# Patient Record
Sex: Male | Born: 1975 | Race: Black or African American | Hispanic: No | Marital: Single | Smoking: Former smoker
Health system: Southern US, Community
[De-identification: ages and names within clinical notes are randomized; demographics above are authoritative.]

## PROBLEM LIST (undated history)

## (undated) DIAGNOSIS — F319 Bipolar disorder, unspecified: Secondary | ICD-10-CM

## (undated) DIAGNOSIS — E785 Hyperlipidemia, unspecified: Secondary | ICD-10-CM

## (undated) DIAGNOSIS — I10 Essential (primary) hypertension: Secondary | ICD-10-CM

## (undated) DIAGNOSIS — F259 Schizoaffective disorder, unspecified: Secondary | ICD-10-CM

---

## 1999-09-13 ENCOUNTER — Emergency Department (HOSPITAL_COMMUNITY): Admission: EM | Admit: 1999-09-13 | Discharge: 1999-09-13 | Payer: Self-pay | Admitting: Emergency Medicine

## 2000-01-20 ENCOUNTER — Emergency Department (HOSPITAL_COMMUNITY): Admission: EM | Admit: 2000-01-20 | Discharge: 2000-01-20 | Payer: Self-pay | Admitting: Emergency Medicine

## 2002-01-16 ENCOUNTER — Emergency Department (HOSPITAL_COMMUNITY): Admission: EM | Admit: 2002-01-16 | Discharge: 2002-01-16 | Payer: Self-pay | Admitting: Emergency Medicine

## 2002-01-18 ENCOUNTER — Emergency Department (HOSPITAL_COMMUNITY): Admission: EM | Admit: 2002-01-18 | Discharge: 2002-01-18 | Payer: Self-pay | Admitting: Emergency Medicine

## 2006-08-26 ENCOUNTER — Emergency Department (HOSPITAL_COMMUNITY): Admission: EM | Admit: 2006-08-26 | Discharge: 2006-08-26 | Payer: Self-pay | Admitting: Emergency Medicine

## 2006-10-22 ENCOUNTER — Emergency Department (HOSPITAL_COMMUNITY): Admission: AD | Admit: 2006-10-22 | Discharge: 2006-10-22 | Payer: Self-pay | Admitting: Family Medicine

## 2007-01-10 ENCOUNTER — Emergency Department (HOSPITAL_COMMUNITY): Admission: EM | Admit: 2007-01-10 | Discharge: 2007-01-10 | Payer: Self-pay | Admitting: Family Medicine

## 2007-01-18 ENCOUNTER — Emergency Department (HOSPITAL_COMMUNITY): Admission: EM | Admit: 2007-01-18 | Discharge: 2007-01-18 | Payer: Self-pay | Admitting: Emergency Medicine

## 2007-06-21 ENCOUNTER — Emergency Department (HOSPITAL_COMMUNITY): Admission: EM | Admit: 2007-06-21 | Discharge: 2007-06-21 | Payer: Self-pay | Admitting: Emergency Medicine

## 2007-07-12 ENCOUNTER — Emergency Department (HOSPITAL_COMMUNITY): Admission: EM | Admit: 2007-07-12 | Discharge: 2007-07-12 | Payer: Self-pay | Admitting: Emergency Medicine

## 2007-12-29 ENCOUNTER — Emergency Department (HOSPITAL_COMMUNITY): Admission: EM | Admit: 2007-12-29 | Discharge: 2007-12-29 | Payer: Self-pay | Admitting: Family Medicine

## 2008-04-11 ENCOUNTER — Emergency Department (HOSPITAL_COMMUNITY): Admission: EM | Admit: 2008-04-11 | Discharge: 2008-04-11 | Payer: Self-pay | Admitting: Family Medicine

## 2008-07-12 ENCOUNTER — Emergency Department (HOSPITAL_COMMUNITY): Admission: EM | Admit: 2008-07-12 | Discharge: 2008-07-12 | Payer: Self-pay | Admitting: Family Medicine

## 2009-09-09 ENCOUNTER — Emergency Department (HOSPITAL_COMMUNITY): Admission: EM | Admit: 2009-09-09 | Discharge: 2009-09-09 | Payer: Self-pay | Admitting: Family Medicine

## 2009-09-17 ENCOUNTER — Emergency Department (HOSPITAL_COMMUNITY): Admission: EM | Admit: 2009-09-17 | Discharge: 2009-09-17 | Payer: Self-pay | Admitting: Emergency Medicine

## 2009-11-12 ENCOUNTER — Emergency Department (HOSPITAL_COMMUNITY): Admission: EM | Admit: 2009-11-12 | Discharge: 2009-11-12 | Payer: Self-pay | Admitting: Emergency Medicine

## 2009-12-26 ENCOUNTER — Ambulatory Visit: Payer: Self-pay | Admitting: Diagnostic Radiology

## 2009-12-26 ENCOUNTER — Emergency Department (HOSPITAL_BASED_OUTPATIENT_CLINIC_OR_DEPARTMENT_OTHER): Admission: EM | Admit: 2009-12-26 | Discharge: 2009-12-26 | Payer: Self-pay | Admitting: Orthopedic Surgery

## 2010-01-22 ENCOUNTER — Emergency Department (HOSPITAL_COMMUNITY): Admission: EM | Admit: 2010-01-22 | Discharge: 2010-01-22 | Payer: Self-pay | Admitting: Family Medicine

## 2010-12-01 ENCOUNTER — Emergency Department (HOSPITAL_COMMUNITY)
Admission: EM | Admit: 2010-12-01 | Discharge: 2010-12-01 | Payer: Self-pay | Source: Home / Self Care | Admitting: Family Medicine

## 2011-02-04 LAB — CBC
HCT: 41 % (ref 39.0–52.0)
Hemoglobin: 14 g/dL (ref 13.0–17.0)
MCHC: 34.3 g/dL (ref 30.0–36.0)
MCV: 88.7 fL (ref 78.0–100.0)
RBC: 4.62 MIL/uL (ref 4.22–5.81)
RDW: 14.1 % (ref 11.5–15.5)

## 2011-02-04 LAB — URINE MICROSCOPIC-ADD ON

## 2011-02-04 LAB — URINALYSIS, ROUTINE W REFLEX MICROSCOPIC
Glucose, UA: NEGATIVE mg/dL
Leukocytes, UA: NEGATIVE
Protein, ur: 100 mg/dL — AB
Specific Gravity, Urine: 1.027 (ref 1.005–1.030)
Urobilinogen, UA: 1 mg/dL (ref 0.0–1.0)

## 2011-02-04 LAB — COMPREHENSIVE METABOLIC PANEL
BUN: 9 mg/dL (ref 6–23)
CO2: 26 mEq/L (ref 19–32)
Calcium: 9.4 mg/dL (ref 8.4–10.5)
GFR calc non Af Amer: >60 mL/min (ref >60)
Glucose, Bld: 80 mg/dL (ref 70–99)
Total Protein: 8.7 g/dL — ABNORMAL HIGH (ref 6.0–8.3)

## 2011-02-04 LAB — DIFFERENTIAL
Basophils Relative: 2 % — ABNORMAL HIGH (ref 0–1)
Eosinophils Absolute: 0.3 10*3/uL (ref 0.0–0.7)
Lymphs Abs: 1.1 10*3/uL (ref 0.7–4.0)
Monocytes Relative: 15 % — ABNORMAL HIGH (ref 3–12)
Neutro Abs: 4.6 10*3/uL (ref 1.7–7.7)
Neutrophils Relative %: 64 % (ref 43–77)

## 2011-02-04 LAB — POCT TOXICOLOGY PANEL: Tetrahydrocannabinol: POSITIVE

## 2012-10-14 ENCOUNTER — Emergency Department (HOSPITAL_COMMUNITY): Payer: Medicare Other

## 2012-10-14 ENCOUNTER — Encounter (HOSPITAL_COMMUNITY): Payer: Self-pay | Admitting: *Deleted

## 2012-10-14 ENCOUNTER — Emergency Department (HOSPITAL_COMMUNITY)
Admission: EM | Admit: 2012-10-14 | Discharge: 2012-10-14 | Disposition: A | Discharge status: Home / Self Care | Payer: Medicare Other | Attending: Emergency Medicine | Admitting: Emergency Medicine

## 2012-10-14 DIAGNOSIS — R51 Headache: Secondary | ICD-10-CM | POA: Insufficient documentation

## 2012-10-14 DIAGNOSIS — D72829 Elevated white blood cell count, unspecified: Secondary | ICD-10-CM | POA: Insufficient documentation

## 2012-10-14 DIAGNOSIS — I10 Essential (primary) hypertension: Secondary | ICD-10-CM | POA: Insufficient documentation

## 2012-10-14 DIAGNOSIS — Z79899 Other long term (current) drug therapy: Secondary | ICD-10-CM | POA: Insufficient documentation

## 2012-10-14 DIAGNOSIS — F17200 Nicotine dependence, unspecified, uncomplicated: Secondary | ICD-10-CM | POA: Insufficient documentation

## 2012-10-14 HISTORY — DX: Essential (primary) hypertension: I10

## 2012-10-14 LAB — COMPREHENSIVE METABOLIC PANEL
ALT: 41 U/L (ref 0–53)
AST: 31 U/L (ref 0–37)
Alkaline Phosphatase: 103 U/L (ref 39–117)
CO2: 23 mEq/L (ref 19–32)
Calcium: 9.9 mg/dL (ref 8.4–10.5)
Chloride: 106 mEq/L (ref 96–112)
GFR calc Af Amer: >90 mL/min (ref >90)
GFR calc non Af Amer: >90 mL/min (ref >90)
Glucose, Bld: 103 mg/dL — ABNORMAL HIGH (ref 70–99)
Potassium: 4.3 mEq/L (ref 3.5–5.1)
Sodium: 143 mEq/L (ref 135–145)

## 2012-10-14 LAB — CBC WITH DIFFERENTIAL/PLATELET
Basophils Absolute: 0 10*3/uL (ref 0.0–0.1)
Eosinophils Relative: 8 % — ABNORMAL HIGH (ref 0–5)
Lymphocytes Relative: 39 % (ref 12–46)
Lymphs Abs: 4.3 10*3/uL — ABNORMAL HIGH (ref 0.7–4.0)
MCV: 87 fL (ref 78.0–100.0)
Neutro Abs: 5 10*3/uL (ref 1.7–7.7)
Neutrophils Relative %: 45 % (ref 43–77)
Platelets: 261 10*3/uL (ref 150–400)
RBC: 4.62 MIL/uL (ref 4.22–5.81)
RDW: 14.7 % (ref 11.5–15.5)
WBC: 11.1 10*3/uL — ABNORMAL HIGH (ref 4.0–10.5)

## 2012-10-14 MED ORDER — PRAVASTATIN SODIUM 20 MG PO TABS: 20.0000 mg | ORAL_TABLET | Freq: Every day | ORAL | Status: DC

## 2012-10-14 MED ORDER — LISINOPRIL 10 MG PO TABS
10.0000 mg | ORAL_TABLET | Freq: Once | ORAL | Status: AC
  Administered 2012-10-14: 10 mg via ORAL
  Filled 2012-10-14: qty 1

## 2012-10-14 MED ORDER — AMLODIPINE BESYLATE 10 MG PO TABS
10.0000 mg | ORAL_TABLET | Freq: Once | ORAL | Status: AC
  Administered 2012-10-14: 10 mg via ORAL
  Filled 2012-10-14 (×2): qty 1

## 2012-10-14 MED ORDER — AMLODIPINE BESYLATE 10 MG PO TABS: 10.0000 mg | ORAL_TABLET | Freq: Once | ORAL | Status: DC

## 2012-10-14 MED ORDER — LISINOPRIL 10 MG PO TABS: 10.0000 mg | ORAL_TABLET | Freq: Once | ORAL | Status: DC

## 2012-10-14 MED ORDER — ACETAMINOPHEN 325 MG PO TABS
650.0000 mg | ORAL_TABLET | Freq: Once | ORAL | Status: AC
  Administered 2012-10-14: 650 mg via ORAL
  Filled 2012-10-14: qty 2

## 2012-10-14 MED ORDER — IBUPROFEN 400 MG PO TABS
800.0000 mg | ORAL_TABLET | Freq: Once | ORAL | Status: DC
  Filled 2012-10-14: qty 2

## 2012-10-14 MED ORDER — FUROSEMIDE 20 MG PO TABS
20.0000 mg | ORAL_TABLET | Freq: Once | ORAL | Status: AC
  Administered 2012-10-14: 20 mg via ORAL
  Filled 2012-10-14: qty 1

## 2012-10-14 MED ORDER — FUROSEMIDE 20 MG PO TABS: 20.0000 mg | ORAL_TABLET | Freq: Once | ORAL | Status: DC

## 2012-10-14 NOTE — ED Provider Notes (Signed)
History     CSN: [id number]  Arrival date & time 10/14/12  1910   First MD Initiated Contact with Patient 10/14/12 2021      Chief Complaint  Patient presents with  . Headache    (Consider location/radiation/quality/duration/timing/severity/associated sxs/prior treatment) HPI Comments: Patient presents with complaint of headache. Patient states that this headache is in his temples, intermittent, and 5/10. He states that he has been out of blood pressure medication for three week and that this is similar to headaches he gets when his blood pressure is high. He requests medication refill. Denies fevers or chills. Denies NVD or abdominal pain, denies photophobia or visual disturbance. Denies that this is the worst headache of his life.   The history is provided by the patient. No language interpreter was used.    Past Medical History  Diagnosis Date  . Hypertension     History reviewed. No pertinent past surgical history.  No family history on file.  History  Substance Use Topics  . Smoking status: Current Every Day Smoker  . Smokeless tobacco: Not on file  . Alcohol Use: No      Review of Systems  Constitutional: Negative for fever and chills.  Eyes: Negative for photophobia and visual disturbance.  Gastrointestinal: Negative for nausea, vomiting, abdominal pain and diarrhea.  Neurological: Negative for headaches.    Allergies  Review of patient's allergies indicates no known allergies.  Home Medications   Current Outpatient Rx  Name  Route  Sig  Dispense  Refill  . AMLODIPINE BESYLATE 10 MG PO TABS   Oral   Take 10 mg by mouth daily.         . FUROSEMIDE 20 MG PO TABS   Oral   Take 20 mg by mouth daily.         Marland Kitchen LISINOPRIL 10 MG PO TABS   Oral   Take 10 mg by mouth daily.         Marland Kitchen PRAVASTATIN SODIUM 20 MG PO TABS   Oral   Take 20 mg by mouth daily.         . DAYQUIL PO   Oral   Take 2 capsules by mouth daily as needed. For cough           . TERBINAFINE HCL 250 MG PO TABS   Oral   Take 250 mg by mouth daily.           BP 146/87  Pulse 72  Temp 98.2 F (36.8 C) (Oral)  Resp 20  SpO2 98%  Physical Exam  Nursing note and vitals reviewed. Constitutional: He is oriented to person, place, and time. He appears well-developed and well-nourished.  HENT:  Head: Normocephalic and atraumatic.  Mouth/Throat: Oropharynx is clear and moist.  Eyes: Conjunctivae normal and EOM are normal. Pupils are equal, round, and reactive to light. No scleral icterus.  Neck: Normal range of motion. Neck supple.  Cardiovascular: Normal rate, regular rhythm and normal heart sounds.   Pulmonary/Chest: Effort normal and breath sounds normal. He has no wheezes.  Abdominal: Soft. Bowel sounds are normal. There is no tenderness.  Lymphadenopathy:    He has no cervical adenopathy.  Neurological: He is alert and oriented to person, place, and time. No cranial nerve deficit. He exhibits normal muscle tone. Coordination normal.       Cranial nerves II-XII grossly intact. No pronator drift. Negative romberg.   Skin: Skin is warm and dry.    ED Course  Procedures (  including critical care time)  Labs Reviewed  CBC WITH DIFFERENTIAL - Abnormal; Notable for the following:    WBC 11.1 (*)     Lymphs Abs 4.3 (*)     Eosinophils Relative 8 (*)     Eosinophils Absolute 0.9 (*)     All other components within normal limits  COMPREHENSIVE METABOLIC PANEL - Abnormal; Notable for the following:    Glucose, Bld 103 (*)     Total Bilirubin 0.1 (*)     All other components within normal limits  TROPONIN I   Dg Chest 2 View  10/14/2012  *RADIOLOGY REPORT*  Clinical Data: Headache.  Left-sided chest pain  CHEST - 2 VIEW  Comparison: 12/26/2009  Findings: Heart size is upper normal.  Negative for heart failure. Lungs are clear without infiltrate effusion or mass.  IMPRESSION: No acute abnormality.   Original Report Authenticated By: Janeece Riggers, M.D.     Date: 10/14/2012  Rate: 76  Rhythm: normal sinus rhythm  QRS Axis: normal  Intervals: normal  ST/T Wave abnormalities: normal  Conduction Disutrbances:none  Narrative Interpretation: NO STEMI  Old EKG Reviewed: none available     1. Headache   2. Hypertension   3. Leukocytosis       MDM  Patient presents with complaint of headache like his other "hypertensive" headaches. Patient given tylenol with improvement. Also requesting medication refills. CBC: remarkable for mild leukocytosis. Other labs, EKG, and CXR unremarkable. Discharged with refills and return precautions.         Pixie Casino, PA-C 10/14/12 2116

## 2012-10-14 NOTE — ED Notes (Signed)
The pt has been out of his meds for bp a fluid pill  And other meds for 2 weeks.  He is also c/o a headache  And some intermittent chest pain

## 2012-10-14 NOTE — ED Notes (Signed)
Pt states that he has had blurred vision occasionally with his headaches. Pt denies dizzines.

## 2012-10-15 NOTE — ED Provider Notes (Signed)
Medical screening examination/treatment/procedure(s) were performed by non-physician practitioner and as supervising physician I was immediately available for consultation/collaboration.   Carleene Cooper III, MD 10/15/12 1300

## 2013-06-25 ENCOUNTER — Emergency Department (INDEPENDENT_AMBULATORY_CARE_PROVIDER_SITE_OTHER)
Admission: EM | Admit: 2013-06-25 | Discharge: 2013-06-25 | Disposition: A | Discharge status: Home / Self Care | Payer: Medicare Other | Source: Home / Self Care

## 2013-06-25 ENCOUNTER — Encounter (HOSPITAL_COMMUNITY): Payer: Self-pay | Admitting: Emergency Medicine

## 2013-06-25 DIAGNOSIS — M25569 Pain in unspecified knee: Secondary | ICD-10-CM

## 2013-06-25 DIAGNOSIS — M25562 Pain in left knee: Secondary | ICD-10-CM

## 2013-06-25 MED ORDER — MELOXICAM 15 MG PO TABS: 15.0000 mg | ORAL_TABLET | Freq: Every day | ORAL | Status: DC | PRN

## 2013-06-25 NOTE — ED Notes (Signed)
C/o left leg pain since last night.   Injury to knee when younger.

## 2013-06-25 NOTE — ED Provider Notes (Signed)
Kenneth Wilson is a 37 y.o. male who presents to Urgent Care today for left knee pain present for the last several days. Patient denies any specific injury but notes that the pain seemed to worsen after doing some squats. He notes that he has had chronic of the anterior knee pain on his current pain is different. His pain is felt deep inside the knee and on the lateral joint line. He denies any locking or catching or significant swelling. He denies any radiating pain weakness or numbness. He has not tried any medications yet. He denies any fevers or chills.   PMH reviewed. Hypertension History  Substance Use Topics  . Smoking status: Current Every Day Smoker  . Smokeless tobacco: Not on file  . Alcohol Use: No   ROS as above Medications reviewed. No current facility-administered medications for this encounter.   Current Outpatient Prescriptions  Medication Sig Dispense Refill  . amLODipine (NORVASC) 10 MG tablet Take 10 mg by mouth daily.      . meloxicam (MOBIC) 15 MG tablet Take 1 tablet (15 mg total) by mouth daily as needed for pain.  14 tablet  0    Exam:  BP 123/73  Pulse 65  Temp(Src) 98 F (36.7 C) (Oral)  Resp 20  SpO2 100% Gen: Well NAD LEFT KNEE: Normal-appearing no significant effusion Range of motion 5-100 No  crepitations on extension Tender palpation lateral joint line Positive McMurray's test laterally. Negative Lachman's valgus varus stress.  Capillary refill and sensation are intact distally bilateral lower extremities.   No results found for this or any previous visit (from the past 24 hour(s)). No results found.  Assessment and Plan: 37 y.o. male with knee pain possible meniscus injury. Plan he is meloxicam for 2 weeks. If not improving followup with Dr. Katrinka Blazing for further evaluation and management.  Discussed warning signs or symptoms. Please see discharge instructions. Patient expresses understanding.      Rodolph Bong, MD 06/25/13 2040

## 2013-08-01 ENCOUNTER — Encounter: Payer: Self-pay | Admitting: Endocrinology

## 2013-08-01 ENCOUNTER — Ambulatory Visit (INDEPENDENT_AMBULATORY_CARE_PROVIDER_SITE_OTHER): Payer: Medicaid Other | Admitting: Endocrinology

## 2013-08-01 VITALS — BP 122/82 | HR 94 | Temp 99.2°F | Resp 10 | Ht 72.0 in | Wt 279.0 lb

## 2013-08-01 DIAGNOSIS — E785 Hyperlipidemia, unspecified: Secondary | ICD-10-CM

## 2013-08-01 DIAGNOSIS — F209 Schizophrenia, unspecified: Secondary | ICD-10-CM

## 2013-08-01 DIAGNOSIS — F649 Gender identity disorder, unspecified: Secondary | ICD-10-CM | POA: Insufficient documentation

## 2013-08-01 DIAGNOSIS — I10 Essential (primary) hypertension: Secondary | ICD-10-CM

## 2013-08-01 DIAGNOSIS — F640 Transsexualism: Secondary | ICD-10-CM

## 2013-08-01 NOTE — Patient Instructions (Addendum)
i'll have to discuss this with a urologist, and i'll let you know.   Usually, the first step is to have the testicles removed.   The second step is to prescribe estrogen.

## 2013-08-01 NOTE — Progress Notes (Signed)
Subjective:    Patient ID: Kenneth Wilson, male    DOB: [birth year], 37 y.o.   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 She has never been on medication for this.  She says she was dx'ed at the [facility] center.  She has moderate swelling at the breasts bilaterally, but no assoc pain. Past Medical History  Diagnosis Date  . Hypertension     History reviewed. No pertinent past surgical history.  History   Social History  . Marital Status: Single    Spouse Name: N/A    Number of Children: N/A  . Years of Education: N/A   Occupational History  . Not on file.   Social History Main Topics  . Smoking status: Former Games developer  . Smokeless tobacco: Not on file  . Alcohol Use: No  . Drug Use: No  . Sexual Activity: No   Other Topics Concern  . Not on file   Social History Narrative   Regular [facility]   Caffeine use: 1 cup of coffee daily    Current Outpatient Prescriptions on File Prior to Visit  Medication Sig Dispense Refill  . amLODipine (NORVASC) 10 MG tablet Take 10 mg by mouth daily.      . meloxicam (MOBIC) 15 MG tablet Take 1 tablet (15 mg total) by mouth daily as needed for pain.  14 tablet  0   No current facility-administered medications on file prior to visit.    No Known Allergies  History reviewed. No pertinent family history. Neg for gender identity issues BP 122/82  Pulse 94  Temp(Src) 99.2 F (37.3 C) (Oral)  Resp 10  Ht 6' (1.829 m)  Wt 279 lb (126.554 kg)  BMI 37.83 kg/m2  SpO2 97%  Review of Systems  Constitutional: Negative for fever.       She has lost weight, due to her efforts.  HENT: Negative for hearing loss.   Eyes: Negative for visual disturbance.  Respiratory: Negative for shortness of breath.   Cardiovascular: Negative for chest pain.  Gastrointestinal: Negative for blood in stool.  Endocrine: Negative for cold intolerance.  Genitourinary: Negative for hematuria.   Musculoskeletal: Negative for back pain.  Skin: Negative for rash.  Allergic/Immunologic: Positive for environmental allergies.  Neurological: Negative for numbness and headaches.  Hematological: Does not bruise/bleed easily.  Psychiatric/Behavioral: Positive for dysphoric mood.      Objective:   Physical Exam VS: see vs page GEN: no distress HEAD: head: no deformity eyes: no periorbital swelling, no proptosis external nose and ears are normal mouth: no lesion seen NECK: supple, thyroid is not enlarged CHEST WALL: no deformity LUNGS:  Clear to auscultation BREASTS:  Moderate bilateral pseudogynecomastia.  CV: reg rate and rhythm, no murmur ABD: abdomen is soft, nontender.  no hepatosplenomegaly.  not distended.  no hernia MUSCULOSKELETAL: muscle bulk and strength are grossly normal.  no obvious joint swelling.  gait is normal and steady EXTEMITIES: no deformity.  no ulcer on the feet.  feet are of normal color and temp.  no edema PULSES: dorsalis pedis intact bilat.  no carotid bruit NEURO:  cn 2-12 grossly intact.   readily moves all 4's.  sensation is intact to touch on the feet SKIN:  Normal texture and temperature.  No rash or suspicious lesion is visible.   NODES:  None palpable at the neck PSYCH: alert, oriented x3.  Does not appear anxious nor depressed.  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  i told pt this is a fluid situation, as medicare has just started paying for transgender surgery, and commerical insurance is likely to start soon.  i told pt she may choose orchiectomy for now, followed by estrogen rx, which i would be happy to prescribe.  pt says she wants to pursue transgender surgery now.  She will ask her insurance, and call us back.    Schizophrenia: this limits the interpretation  of sxs HTN: pt would have to be monitored wen started on estrogen

## 2013-08-03 ENCOUNTER — Telehealth: Payer: Self-pay

## 2013-08-03 DIAGNOSIS — F649 Gender identity disorder, unspecified: Secondary | ICD-10-CM

## 2013-08-03 NOTE — Progress Notes (Signed)
  Subjective:    Patient ID: Kenneth Wilson, male    DOB: Jun 03, 1976, 37 y.o.   MRN: [id number]  HPI    Review of Systems     Objective:   Physical Exam GENITALIA: Normal male testicles, scrotum, and penis Skin: moderate male terminal hair.       Assessment & Plan:

## 2013-08-03 NOTE — Telephone Encounter (Signed)
i have requested

## 2013-08-03 NOTE — Telephone Encounter (Signed)
Pt states that he needs to know who you recommend as a urologist that will perform the procedure that he needs, he check ed with his ins., the urologist here in  do not do the procedure. [phone]

## 2013-08-03 NOTE — Telephone Encounter (Signed)
Pt advised.

## 2013-08-07 ENCOUNTER — Telehealth: Payer: Self-pay

## 2013-08-07 NOTE — Telephone Encounter (Signed)
Pt was advised per Beverly Gust and Dr. Everardo All pt needs to get a referral from his pcp listed on medicaid card

## 2013-08-07 NOTE — Telephone Encounter (Signed)
Message copied by Sharlyne Pacas on Tue Aug 07, 2013  1:19 PM ------      Message from: Romero Belling      Created: Tue Aug 07, 2013 12:36 PM       Ok, please call patient and pcp and advise them of this.                  ----- Message -----         From: Sharlyne Pacas, CMA         Sent: 08/07/2013   9:20 AM           To: Romero Belling, MD                        ----- Message -----         From: Janee Morn         Sent: 08/07/2013   9:07 AM           To: Romero Belling, MD, Sharlyne Pacas, CMA            This pt has [place] access referral will need to come from provider office/ doctor listed on card.        ------

## 2013-09-25 ENCOUNTER — Ambulatory Visit
Admission: RE | Admit: 2013-09-25 | Discharge: 2013-09-25 | Disposition: A | Discharge status: Home / Self Care | Payer: Medicare HMO | Source: Ambulatory Visit | Attending: Family | Admitting: Family

## 2013-09-25 ENCOUNTER — Other Ambulatory Visit: Payer: Self-pay | Admitting: Family

## 2013-09-25 DIAGNOSIS — M79605 Pain in left leg: Secondary | ICD-10-CM

## 2014-02-21 ENCOUNTER — Emergency Department (INDEPENDENT_AMBULATORY_CARE_PROVIDER_SITE_OTHER)
Admission: EM | Admit: 2014-02-21 | Discharge: 2014-02-21 | Disposition: A | Discharge status: Home / Self Care | Payer: Medicare HMO | Source: Home / Self Care | Attending: Emergency Medicine | Admitting: Emergency Medicine

## 2014-02-21 ENCOUNTER — Encounter (HOSPITAL_COMMUNITY): Payer: Self-pay | Admitting: Emergency Medicine

## 2014-02-21 DIAGNOSIS — J309 Allergic rhinitis, unspecified: Secondary | ICD-10-CM

## 2014-02-21 DIAGNOSIS — R059 Cough, unspecified: Secondary | ICD-10-CM

## 2014-02-21 DIAGNOSIS — R05 Cough: Secondary | ICD-10-CM

## 2014-02-21 DIAGNOSIS — J9801 Acute bronchospasm: Secondary | ICD-10-CM

## 2014-02-21 MED ORDER — CETIRIZINE HCL 10 MG PO TABS: 10.0000 mg | ORAL_TABLET | Freq: Every day | ORAL | Status: DC

## 2014-02-21 MED ORDER — FLUTICASONE PROPIONATE 50 MCG/ACT NA SUSP: 2.0000 | Freq: Every day | NASAL | Status: DC

## 2014-02-21 MED ORDER — IPRATROPIUM BROMIDE 0.02 % IN SOLN
0.5000 mg | Freq: Once | RESPIRATORY_TRACT | Status: AC
  Administered 2014-02-21: 0.5 mg via RESPIRATORY_TRACT

## 2014-02-21 MED ORDER — IPRATROPIUM BROMIDE 0.02 % IN SOLN
RESPIRATORY_TRACT | Status: AC
  Filled 2014-02-21: qty 2.5

## 2014-02-21 MED ORDER — ALBUTEROL SULFATE HFA 108 (90 BASE) MCG/ACT IN AERS
INHALATION_SPRAY | RESPIRATORY_TRACT | Status: AC
  Filled 2014-02-21: qty 6.7

## 2014-02-21 MED ORDER — ALBUTEROL SULFATE (2.5 MG/3ML) 0.083% IN NEBU
INHALATION_SOLUTION | RESPIRATORY_TRACT | Status: AC
  Filled 2014-02-21: qty 6

## 2014-02-21 MED ORDER — ALBUTEROL SULFATE HFA 108 (90 BASE) MCG/ACT IN AERS: 1.0000 | INHALATION_SPRAY | Freq: Four times a day (QID) | RESPIRATORY_TRACT | Status: DC | PRN

## 2014-02-21 MED ORDER — ALBUTEROL SULFATE (2.5 MG/3ML) 0.083% IN NEBU
5.0000 mg | INHALATION_SOLUTION | Freq: Once | RESPIRATORY_TRACT | Status: AC
  Administered 2014-02-21: 5 mg via RESPIRATORY_TRACT

## 2014-02-21 NOTE — Discharge Instructions (Signed)
Allergic Rhinitis °Allergic rhinitis is when the mucous membranes in the nose respond to allergens. Allergens are particles in the air that cause your body to have an allergic reaction. This causes you to release allergic antibodies. Through a chain of events, these eventually cause you to release histamine into the blood stream. Although meant to protect the body, it is this release of histamine that causes your discomfort, such as frequent sneezing, congestion, and an itchy, runny nose.  °CAUSES  °Seasonal allergic rhinitis (hay fever) is caused by pollen allergens that may come from grasses, trees, and weeds. Year-round allergic rhinitis (perennial allergic rhinitis) is caused by allergens such as house dust mites, pet dander, and mold spores.  °SYMPTOMS  °· Nasal stuffiness (congestion). °· Itchy, runny nose with sneezing and tearing of the eyes. °DIAGNOSIS  °Your health care provider can help you determine the allergen or allergens that trigger your symptoms. If you and your health care provider are unable to determine the allergen, skin or blood testing may be used. °TREATMENT  °Allergic Rhinitis does not have a cure, but it can be controlled by: °· Medicines and allergy shots (immunotherapy). °· Avoiding the allergen. °Hay fever may often be treated with antihistamines in pill or nasal spray forms. Antihistamines block the effects of histamine. There are over-the-counter medicines that may help with nasal congestion and swelling around the eyes. Check with your health care provider before taking or giving this medicine.  °If avoiding the allergen or the medicine prescribed do not work, there are many new medicines your health care provider can prescribe. Stronger medicine may be used if initial measures are ineffective. Desensitizing injections can be used if medicine and avoidance does not work. Desensitization is when a patient is given ongoing shots until the body becomes less sensitive to the allergen.  Make sure you follow up with your health care provider if problems continue. °HOME CARE INSTRUCTIONS °It is not possible to completely avoid allergens, but you can reduce your symptoms by taking steps to limit your exposure to them. It helps to know exactly what you are allergic to so that you can avoid your specific triggers. °SEEK MEDICAL CARE IF:  °· You have a fever. °· You develop a cough that does not stop easily (persistent). °· You have shortness of breath. °· You start wheezing. °· Symptoms interfere with normal daily activities. °Document Released: 07/27/2001 Document Revised: 08/22/2013 Document Reviewed: 07/09/2013 °ExitCare® Patient Information ©2014 [employer]. ° °Bronchospasm, Adult °A bronchospasm is a spasm or tightening of the airways going into the lungs. During a bronchospasm breathing becomes more difficult because the airways get smaller. When this happens there can be coughing, a whistling sound when breathing (wheezing), and difficulty breathing. Bronchospasm is often associated with asthma, but not all patients who experience a bronchospasm have asthma. °CAUSES  °A bronchospasm is caused by inflammation or irritation of the airways. The inflammation or irritation may be triggered by:  °· Allergies (such as to animals, pollen, food, or mold). Allergens that cause bronchospasm may cause wheezing immediately after exposure or many hours later.   °· Infection. Viral infections are believed to be the most common cause of bronchospasm.   °· Exercise.   °· Irritants (such as pollution, cigarette smoke, strong odors, aerosol sprays, and paint fumes).   °· Weather changes. Winds increase molds and pollens in the air. Rain refreshes the air by washing irritants out. Cold air may cause inflammation.   °· Stress and emotional upset.   °SIGNS AND SYMPTOMS  °· Wheezing.   °·   Excessive nighttime coughing.   Frequent or severe coughing with a simple cold.   Chest tightness.   Shortness of  breath.  DIAGNOSIS  Bronchospasm is usually diagnosed through a history and physical exam. Tests, such as chest X-rays, are sometimes done to look for other conditions. TREATMENT   Inhaled medicines can be given to open up your airways and help you breathe. The medicines can be given using either an inhaler or a nebulizer machine.  Corticosteroid medicines may be given for severe bronchospasm, usually when it is associated with asthma. HOME CARE INSTRUCTIONS   Always have a plan prepared for seeking medical care. Know when to call your health care provider and local emergency services (911 in the [place] Know where you can access local emergency care.  Only take medicines as directed by your health care provider.  If you were prescribed an inhaler or nebulizer machine, ask your health care provider to explain how to use it correctly. Always use a spacer with your inhaler if you were given one.  It is necessary to remain calm during an attack. Try to relax and breathe more slowly.  Control your home environment in the following ways:   Change your heating and air conditioning filter at least once a month.   Limit your use of fireplaces and wood stoves.  Do not smoke and do not allow smoking in your home.   Avoid exposure to perfumes and fragrances.   Get rid of pests (such as roaches and mice) and their droppings.   Throw away plants if you see mold on them.   Keep your house clean and dust free.   Replace carpet with wood, tile, or vinyl flooring. Carpet can trap dander and dust.   Use allergy-proof pillows, mattress covers, and box spring covers.   Wash bed sheets and blankets every week in hot water and dry them in a dryer.   Use blankets that are made of polyester or cotton.   Wash hands frequently. SEEK MEDICAL CARE IF:   You have muscle aches.   You have chest pain.   The sputum changes from clear or white to yellow, green, gray, or bloody.   The  sputum you cough up gets thicker.   There are problems that may be related to the medicine you are given, such as a rash, itching, swelling, or trouble breathing.  SEEK IMMEDIATE MEDICAL CARE IF:   You have worsening wheezing and coughing even after taking your prescribed medicines.   You have increased difficulty breathing.   You develop severe chest pain. MAKE SURE YOU:   Understand these instructions.  Will watch your condition.  Will get help right away if you are not doing well or get worse. Document Released: 11/04/2003 Document Revised: 07/04/2013 Document Reviewed: 04/23/2013 [facility] Patient Information 2014 [place], [place].  Bronchospasm, Adult A bronchospasm is a spasm or tightening of the airways going into the lungs. During a bronchospasm breathing becomes more difficult because the airways get smaller. When this happens there can be coughing, a whistling sound when breathing (wheezing), and difficulty breathing. Bronchospasm is often associated with asthma, but not all patients who experience a bronchospasm have asthma. CAUSES  A bronchospasm is caused by inflammation or irritation of the airways. The inflammation or irritation may be triggered by:   Allergies (such as to animals, pollen, food, or mold). Allergens that cause bronchospasm may cause wheezing immediately after exposure or many hours later.   Infection. Viral infections are believed to  be the most common cause of bronchospasm.   Exercise.   Irritants (such as pollution, cigarette smoke, strong odors, aerosol sprays, and paint fumes).   Weather changes. Winds increase molds and pollens in the air. Rain refreshes the air by washing irritants out. Cold air may cause inflammation.   Stress and emotional upset.  SIGNS AND SYMPTOMS   Wheezing.   Excessive nighttime coughing.   Frequent or severe coughing with a simple cold.   Chest tightness.   Shortness of breath.  DIAGNOSIS    Bronchospasm is usually diagnosed through a history and physical exam. Tests, such as chest X-rays, are sometimes done to look for other conditions. TREATMENT   Inhaled medicines can be given to open up your airways and help you breathe. The medicines can be given using either an inhaler or a nebulizer machine.  Corticosteroid medicines may be given for severe bronchospasm, usually when it is associated with asthma. HOME CARE INSTRUCTIONS   Always have a plan prepared for seeking medical care. Know when to call your health care provider and local emergency services (911 in the [place] Know where you can access local emergency care.  Only take medicines as directed by your health care provider.  If you were prescribed an inhaler or nebulizer machine, ask your health care provider to explain how to use it correctly. Always use a spacer with your inhaler if you were given one.  It is necessary to remain calm during an attack. Try to relax and breathe more slowly.  Control your home environment in the following ways:   Change your heating and air conditioning filter at least once a month.   Limit your use of fireplaces and wood stoves.  Do not smoke and do not allow smoking in your home.   Avoid exposure to perfumes and fragrances.   Get rid of pests (such as roaches and mice) and their droppings.   Throw away plants if you see mold on them.   Keep your house clean and dust free.   Replace carpet with wood, tile, or vinyl flooring. Carpet can trap dander and dust.   Use allergy-proof pillows, mattress covers, and box spring covers.   Wash bed sheets and blankets every week in hot water and dry them in a dryer.   Use blankets that are made of polyester or cotton.   Wash hands frequently. SEEK MEDICAL CARE IF:   You have muscle aches.   You have chest pain.   The sputum changes from clear or white to yellow, green, gray, or bloody.   The sputum you cough up  gets thicker.   There are problems that may be related to the medicine you are given, such as a rash, itching, swelling, or trouble breathing.  SEEK IMMEDIATE MEDICAL CARE IF:   You have worsening wheezing and coughing even after taking your prescribed medicines.   You have increased difficulty breathing.   You develop severe chest pain. MAKE SURE YOU:   Understand these instructions.  Will watch your condition.  Will get help right away if you are not doing well or get worse. Document Released: 11/04/2003 Document Revised: 07/04/2013 Document Reviewed: 04/23/2013 [facility] Patient Information 2014 [place], [place].

## 2014-02-21 NOTE — ED Provider Notes (Signed)
CSN: [id number]     Arrival date & time 02/21/14  1136 History   First MD Initiated Contact with Patient 02/21/14 1224     Chief Complaint  Patient presents with  . Cough   (Consider location/radiation/quality/duration/timing/severity/associated sxs/prior Treatment) Patient is a 38 y.o. male presenting with cough. The history is provided by the patient.  Cough Cough characteristics:  Harsh Severity:  Moderate Onset quality:  Gradual Duration:  3 days Timing:  Intermittent Progression:  Unchanged Chronicity:  New Smoker: yes   Context: exposure to allergens   Associated symptoms: rhinorrhea, shortness of breath, sinus congestion and wheezing   Associated symptoms: no chest pain, no chills, no diaphoresis, no ear fullness, no ear pain, no eye discharge, no fever, no headaches, no myalgias, no rash, no sore throat and no weight loss   Associated symptoms comment:  +post nasal drainage   Past Medical History  Diagnosis Date  . Hypertension    History reviewed. No pertinent past surgical history. History reviewed. No pertinent family history. History  Substance Use Topics  . Smoking status: Former Games developermoker  . Smokeless tobacco: Not on file  . Alcohol Use: No    Review of Systems  Constitutional: Negative for fever, chills, weight loss and diaphoresis.  HENT: Positive for congestion, rhinorrhea and sneezing. Negative for ear pain, mouth sores and sore throat.   Eyes: Negative.  Negative for discharge.  Respiratory: Positive for cough, chest tightness, shortness of breath and wheezing.   Cardiovascular: Negative for chest pain, palpitations and leg swelling.  Gastrointestinal: Negative.   Endocrine: Negative for polydipsia, polyphagia and polyuria.  Genitourinary: Negative.   Musculoskeletal: Negative.  Negative for myalgias.  Skin: Negative.  Negative for rash.  Neurological: Negative.  Negative for headaches.    Allergies  Review of patient's allergies indicates no known  allergies.  Home Medications   Current Outpatient Rx  Name  Route  Sig  Dispense  Refill  . albuterol (PROVENTIL HFA;VENTOLIN HFA) 108 (90 BASE) MCG/ACT inhaler   Inhalation   Inhale 1-2 puffs into the lungs every 6 (six) hours as needed for wheezing or shortness of breath.   1 Inhaler   1   . amLODipine (NORVASC) 10 MG tablet   Oral   Take 10 mg by mouth daily.         . benztropine (COGENTIN) 2 MG tablet   Oral   Take 2 mg by mouth daily.         . cetirizine (ZYRTEC) 10 MG tablet   Oral   Take 1 tablet (10 mg total) by mouth daily.   30 tablet   1   . fluticasone (FLONASE) 50 MCG/ACT nasal spray   Each Nare   Place 2 sprays into both nostrils daily.   16 g   1   . furosemide (LASIX) 20 MG tablet   Oral   Take 20 mg by mouth daily.         . meloxicam (MOBIC) 15 MG tablet   Oral   Take 1 tablet (15 mg total) by mouth daily as needed for pain.   14 tablet   0   . pravastatin (PRAVACHOL) 10 MG tablet   Oral   Take 10 mg by mouth daily.         . sertraline (ZOLOFT) 25 MG tablet   Oral   Take 25 mg by mouth daily.         . ziprasidone (GEODON) 80 MG capsule   Oral  Take 80 mg by mouth 2 (two) times daily with a meal.          BP 127/92  Pulse 73  Temp(Src) 97.3 F (36.3 C) (Oral)  Resp 13  SpO2 99% Physical Exam  Nursing note and vitals reviewed. Constitutional: He is oriented to person, place, and time. He appears well-developed and well-nourished. No distress.  HENT:  Head: Normocephalic and atraumatic.  Right Ear: Hearing, tympanic membrane, external ear and ear canal normal.  Left Ear: Hearing, tympanic membrane, external ear and ear canal normal.  Nose: Mucosal edema and rhinorrhea present.  Mouth/Throat: Uvula is midline, oropharynx is clear and moist and mucous membranes are normal.  Eyes: Conjunctivae are normal.  Neck: Normal range of motion. Neck supple.  Cardiovascular: Normal rate, regular rhythm and normal heart sounds.    Pulmonary/Chest: Effort normal. No respiratory distress. He has wheezes. He has no rales.  Musculoskeletal: Normal range of motion.  Lymphadenopathy:    He has cervical adenopathy.  Neurological: He is alert and oriented to person, place, and time.  Skin: Skin is warm and dry.  Psychiatric: He has a normal mood and affect. His behavior is normal.    ED Course  Procedures (including critical care time) Labs Review Labs Reviewed - No data to display Imaging Review No results found.   MDM   1. Allergic rhinitis   2. Bronchospasm   3. Cough   Reports significant improvement following albuterol/atrovent neb tx at [facility]. Will treat at home with Flonase, Albuterol MDI and Zyrtec and advise PCP follow up if no improvement.     Jess Barters [place], [place] 02/21/14 1356

## 2014-02-21 NOTE — ED Provider Notes (Signed)
Medical screening examination/treatment/procedure(s) were performed by non-physician practitioner and as supervising physician I was immediately available for consultation/collaboration.  Hridaan Bouse, M.D.  Calen Posch C Petronella Shuford, MD 02/21/14 1447 

## 2014-02-21 NOTE — ED Notes (Signed)
Pt reports        Symptoms  Of  copugh  /  Congested         With  Some  Shortness  Of  Breath  And  Tightness  In chest  For  sev  Days          The  Cough is  Productive  And  The  Symptoms  Have  Not  Been  releived  By   otc      Allergy  meds

## 2014-06-09 ENCOUNTER — Encounter (HOSPITAL_COMMUNITY): Payer: Self-pay | Admitting: Emergency Medicine

## 2014-06-09 ENCOUNTER — Emergency Department (HOSPITAL_COMMUNITY): Payer: Medicare HMO

## 2014-06-09 ENCOUNTER — Emergency Department (HOSPITAL_COMMUNITY)
Admission: EM | Admit: 2014-06-09 | Discharge: 2014-06-09 | Disposition: A | Discharge status: Home / Self Care | Payer: Medicare HMO | Attending: Emergency Medicine | Admitting: Emergency Medicine

## 2014-06-09 DIAGNOSIS — Z79899 Other long term (current) drug therapy: Secondary | ICD-10-CM | POA: Insufficient documentation

## 2014-06-09 DIAGNOSIS — Y929 Unspecified place or not applicable: Secondary | ICD-10-CM | POA: Insufficient documentation

## 2014-06-09 DIAGNOSIS — W1809XA Striking against other object with subsequent fall, initial encounter: Secondary | ICD-10-CM | POA: Diagnosis not present

## 2014-06-09 DIAGNOSIS — Y9389 Activity, other specified: Secondary | ICD-10-CM | POA: Insufficient documentation

## 2014-06-09 DIAGNOSIS — S46909A Unspecified injury of unspecified muscle, fascia and tendon at shoulder and upper arm level, unspecified arm, initial encounter: Secondary | ICD-10-CM | POA: Diagnosis not present

## 2014-06-09 DIAGNOSIS — I10 Essential (primary) hypertension: Secondary | ICD-10-CM | POA: Insufficient documentation

## 2014-06-09 DIAGNOSIS — F17200 Nicotine dependence, unspecified, uncomplicated: Secondary | ICD-10-CM | POA: Diagnosis not present

## 2014-06-09 DIAGNOSIS — IMO0002 Reserved for concepts with insufficient information to code with codable children: Secondary | ICD-10-CM | POA: Diagnosis present

## 2014-06-09 DIAGNOSIS — S4980XA Other specified injuries of shoulder and upper arm, unspecified arm, initial encounter: Secondary | ICD-10-CM | POA: Diagnosis not present

## 2014-06-09 DIAGNOSIS — Z791 Long term (current) use of non-steroidal anti-inflammatories (NSAID): Secondary | ICD-10-CM | POA: Diagnosis not present

## 2014-06-09 DIAGNOSIS — F259 Schizoaffective disorder, unspecified: Secondary | ICD-10-CM | POA: Insufficient documentation

## 2014-06-09 DIAGNOSIS — M545 Low back pain, unspecified: Secondary | ICD-10-CM

## 2014-06-09 DIAGNOSIS — E785 Hyperlipidemia, unspecified: Secondary | ICD-10-CM | POA: Insufficient documentation

## 2014-06-09 DIAGNOSIS — W19XXXA Unspecified fall, initial encounter: Secondary | ICD-10-CM

## 2014-06-09 DIAGNOSIS — F319 Bipolar disorder, unspecified: Secondary | ICD-10-CM | POA: Insufficient documentation

## 2014-06-09 DIAGNOSIS — M25512 Pain in left shoulder: Secondary | ICD-10-CM

## 2014-06-09 HISTORY — DX: Schizoaffective disorder, unspecified: F25.9

## 2014-06-09 HISTORY — DX: Bipolar disorder, unspecified: F31.9

## 2014-06-09 HISTORY — DX: Hyperlipidemia, unspecified: E78.5

## 2014-06-09 MED ORDER — OXYCODONE-ACETAMINOPHEN 5-325 MG PO TABS
2.0000 | ORAL_TABLET | Freq: Once | ORAL | Status: AC
  Administered 2014-06-09: 2 via ORAL
  Filled 2014-06-09: qty 2

## 2014-06-09 MED ORDER — OXYCODONE-ACETAMINOPHEN 5-325 MG PO TABS: 1.0000 | ORAL_TABLET | Freq: Four times a day (QID) | ORAL | Status: DC | PRN

## 2014-06-09 MED ORDER — IBUPROFEN 800 MG PO TABS: 800.0000 mg | ORAL_TABLET | Freq: Three times a day (TID) | ORAL | Status: DC

## 2014-06-09 NOTE — ED Provider Notes (Signed)
Medical screening examination/treatment/procedure(s) were performed by non-physician practitioner and as supervising physician I was immediately available for consultation/collaboration.   EKG Interpretation None       Glynn OctaveStephen Salena Ortlieb, MD 06/09/14 1735

## 2014-06-09 NOTE — Discharge Instructions (Signed)
Your x-rays were negative for any broken bones at today's visit. Please follow up with your primary care physician in 1-2 days. If you do not have one please call the [facility] number listed above. Please take pain medication and/or muscle relaxants as prescribed and as needed for pain. Please do not drive on narcotic pain medication or on muscle relaxants. Please read all discharge instructions and return precautions.   Back Pain, Adult Low back pain is very common. About 1 in 5 people have back pain.The cause of low back pain is rarely dangerous. The pain often gets better over time.About half of people with a sudden onset of back pain feel better in just 2 weeks. About 8 in 10 people feel better by 6 weeks.  CAUSES Some common causes of back pain include:  Strain of the muscles or ligaments supporting the spine.  Wear and tear (degeneration) of the spinal discs.  Arthritis.  Direct injury to the back. DIAGNOSIS Most of the time, the direct cause of low back pain is not known.However, back pain can be treated effectively even when the exact cause of the pain is unknown.Answering your caregiver's questions about your overall health and symptoms is one of the most accurate ways to make sure the cause of your pain is not dangerous. If your caregiver needs more information, he or she may order lab work or imaging tests (X-rays or MRIs).However, even if imaging tests show changes in your back, this usually does not require surgery. HOME CARE INSTRUCTIONS For many people, back pain returns.Since low back pain is rarely dangerous, it is often a condition that people can learn to [facility] their own.   Remain active. It is stressful on the back to sit or stand in one place. Do not sit, drive, or stand in one place for more than 30 minutes at a time. Take short walks on level surfaces as soon as pain allows.Try to increase the length of time you walk each day.  Do not stay  in bed.Resting more than 1 or 2 days can delay your recovery.  Do not avoid exercise or work.Your body is made to move.It is not dangerous to be active, even though your back may hurt.Your back will likely heal faster if you return to being active before your pain is gone.  Pay attention to your body when you bend and lift. Many people have less discomfortwhen lifting if they bend their knees, keep the load close to their bodies,and avoid twisting. Often, the most comfortable positions are those that put less stress on your recovering back.  Find a comfortable position to sleep. Use a firm mattress and lie on your side with your knees slightly bent. If you lie on your back, put a pillow under your knees.  Only take over-the-counter or prescription medicines as directed by your caregiver. Over-the-counter medicines to reduce pain and inflammation are often the most helpful.Your caregiver may prescribe muscle relaxant drugs.These medicines help dull your pain so you can more quickly return to your normal activities and healthy exercise.  Put ice on the injured area.  Put ice in a plastic bag.  Place a towel between your skin and the bag.  Leave the ice on for 15-20 minutes, 03-04 times a day for the first 2 to 3 days. After that, ice and heat may be alternated to reduce pain and spasms.  Ask your caregiver about trying back exercises and gentle massage. This may be of some  benefit.  Avoid feeling anxious or stressed.Stress increases muscle tension and can worsen back pain.It is important to recognize when you are anxious or stressed and learn ways to manage it.Exercise is a great option. SEEK MEDICAL CARE IF:  You have pain that is not relieved with rest or medicine.  You have pain that does not improve in 1 week.  You have new symptoms.  You are generally not feeling well. SEEK IMMEDIATE MEDICAL CARE IF:   You have pain that radiates from your back into your legs.  You  develop new bowel or bladder control problems.  You have unusual weakness or numbness in your arms or legs.  You develop nausea or vomiting.  You develop abdominal pain.  You feel faint. Document Released: 11/01/2005 Document Revised: 05/02/2012 Document Reviewed: 03/05/2014 [facility] Patient Information 2015 [place], [place]. This information is not intended to replace advice given to you by your health care provider. Make sure you discuss any questions you have with your health care provider.  Shoulder Pain The shoulder is the joint that connects your arms to your body. The bones that form the shoulder joint include the upper arm bone (humerus), the shoulder blade (scapula), and the collarbone (clavicle). The top of the humerus is shaped like a ball and fits into a rather flat socket on the scapula (glenoid cavity). A combination of muscles and strong, fibrous tissues that connect muscles to bones (tendons) support your shoulder joint and hold the ball in the socket. Small, fluid-filled sacs (bursae) are located in different areas of the joint. They act as cushions between the bones and the overlying soft tissues and help reduce friction between the gliding tendons and the bone as you move your arm. Your shoulder joint allows a wide range of motion in your arm. This range of motion allows you to do things like scratch your back or throw a ball. However, this range of motion also makes your shoulder more prone to pain from overuse and injury. Causes of shoulder pain can originate from both injury and overuse and usually can be grouped in the following four categories:  Redness, swelling, and pain (inflammation) of the tendon (tendinitis) or the bursae (bursitis).  Instability, such as a dislocation of the joint.  Inflammation of the joint (arthritis).  Broken bone (fracture). HOME CARE INSTRUCTIONS   Apply ice to the sore area.  Put ice in a plastic bag.  Place a towel between your skin  and the bag.  Leave the ice on for 15-20 minutes, 3-4 times per day for the first 2 days, or as directed by your health care provider.  Stop using cold packs if they do not help with the pain.  If you have a shoulder sling or immobilizer, wear it as long as your caregiver instructs. Only remove it to shower or bathe. Move your arm as little as possible, but keep your hand moving to prevent swelling.  Squeeze a soft ball or foam pad as much as possible to help prevent swelling.  Only take over-the-counter or prescription medicines for pain, discomfort, or fever as directed by your caregiver. SEEK MEDICAL CARE IF:   Your shoulder pain increases, or new pain develops in your arm, hand, or fingers.  Your hand or fingers become cold and numb.  Your pain is not relieved with medicines. SEEK IMMEDIATE MEDICAL CARE IF:   Your arm, hand, or fingers are numb or tingling.  Your arm, hand, or fingers are significantly swollen or turn white  or blue. MAKE SURE YOU:   Understand these instructions.  Will watch your condition.  Will get help right away if you are not doing well or get worse. Document Released: 08/11/2005 Document Revised: 03/18/2014 Document Reviewed: 10/16/2011 [facility] Patient Information 2015 [place]. This information is not intended to replace advice given to you by your health care provider. Make sure you discuss any questions you have with your health care provider.

## 2014-06-09 NOTE — ED Notes (Signed)
Pt states he cannot bend forward because the pain is too much. He also cannot raise left arm above his shoulder. No obvious deformity noted, no bruising or redness noted.

## 2014-06-09 NOTE — ED Notes (Signed)
Pt states he got up this morning and fell. His knees gave out. Pt states he hit the table and hit the floor. Pt hit his left shoulder and back. Did not hit head, no LOC. Pt states he cannot bend down and sit down comfortably.

## 2014-06-09 NOTE — ED Provider Notes (Signed)
CSN: [id number]     Arrival date & time 06/09/14  0757 History   First MD Initiated Contact with Patient 06/09/14 [phone]     Chief Complaint  Patient presents with  . Fall     (Consider location/radiation/quality/duration/timing/severity/associated sxs/prior Treatment) HPI Comments: Patient is a 38 yo F PMHx significant for HTN, HLD, Bipolar 1 disorder, schizoaffective disorder presenting to the ED after a fall, after getting up from bed when his knee gave out. He fell and hit the table with his left shoulder and then landed on the ground on his butt. Denies hitting his head or losing consciousness. Is complaining of left scapular pain and low back pain without radiation. Alleviating factors: none. Aggravating factors: movement, ambulating. Medications tried prior to arrival: none.    Patient is a 38 y.o. male presenting with fall.  Fall Associated symptoms include arthralgias and myalgias. Pertinent negatives include no chest pain, chills or fever.    Past Medical History  Diagnosis Date  . Hypertension   . Hyperlipemia   . Bipolar 1 disorder   . Schizoaffective disorder    History reviewed. No pertinent past surgical history. History reviewed. No pertinent family history. History  Substance Use Topics  . Smoking status: Current Every Day Smoker  . Smokeless tobacco: Not on file  . Alcohol Use: Yes    Review of Systems  Constitutional: Negative for fever and chills.  Respiratory: Negative for shortness of breath.   Cardiovascular: Negative for chest pain.  Musculoskeletal: Positive for arthralgias, back pain and myalgias.  All other systems reviewed and are negative.     Allergies  Review of patient's allergies indicates no known allergies.  Home Medications   Prior to Admission medications   Medication Sig Start Date End Date Taking? Authorizing Provider  albuterol (PROVENTIL HFA;VENTOLIN HFA) 108 (90 BASE) MCG/ACT inhaler Inhale 1-2 puffs into the lungs every 6  (six) hours as needed for wheezing or shortness of breath. 02/21/14  Yes Jess BartersJennifer Lee Presson, PA  amLODipine (NORVASC) 10 MG tablet Take 10 mg by mouth daily.   Yes Historical Provider, MD  benztropine (COGENTIN) 2 MG tablet Take 2 mg by mouth daily.   Yes Historical Provider, MD  carbamazepine (TEGRETOL) 100 MG chewable tablet Chew 200 mg by mouth at bedtime.   Yes Historical Provider, MD  furosemide (LASIX) 20 MG tablet Take 20 mg by mouth daily.   Yes Historical Provider, MD  lisinopril (PRINIVIL,ZESTRIL) 10 MG tablet Take 10 mg by mouth 2 (two) times daily. 05/14/14  Yes Historical Provider, MD  pravastatin (PRAVACHOL) 10 MG tablet Take 10 mg by mouth daily.   Yes Historical Provider, MD  SERTRALINE HCL PO Take 2 tablets by mouth daily.   Yes Historical Provider, MD  Tetrahydrozoline HCl (VISINE OP) Place 2 drops into both eyes daily as needed (for dry eyes).   Yes Historical Provider, MD  ziprasidone (GEODON) 80 MG capsule Take 80 mg by mouth 2 (two) times daily with a meal.   Yes Historical Provider, MD  ibuprofen (ADVIL,MOTRIN) 800 MG tablet Take 1 tablet (800 mg total) by mouth 3 (three) times daily. 06/09/14   Wacey Zieger L Baily Hovanec, PA-C  oxyCODONE-acetaminophen (PERCOCET/ROXICET) 5-325 MG per tablet Take 1-2 tablets by mouth every 6 (six) hours as needed for severe pain. May take 2 tablets PO q 6 hours for severe pain - Do not take with Tylenol as this tablet already contains tylenol 06/09/14   Zo Loudon L Cael Worth, PA-C   BP 135/102  Pulse  82  Temp(Src) 98 F (36.7 C) (Oral)  Resp 16  Ht 6\' 1"  (1.854 m)  Wt 247 lb (112.038 kg)  BMI 32.59 kg/m2  SpO2 100% Physical Exam  Nursing note and vitals reviewed. Constitutional: He is oriented to person, place, and time. He appears well-developed and well-nourished. No distress.  HENT:  Head: Normocephalic and atraumatic.  Right Ear: External ear normal.  Left Ear: External ear normal.  Nose: Nose normal.  Mouth/Throat: Oropharynx is  clear and moist. No oropharyngeal exudate.  Eyes: Conjunctivae and EOM are normal. Pupils are equal, round, and reactive to light.  Neck: Normal range of motion. Neck supple.  Cardiovascular: Normal rate, regular rhythm, normal heart sounds and intact distal pulses.   Pulmonary/Chest: Effort normal and breath sounds normal. No respiratory distress.  Abdominal: Soft. There is no tenderness.  Musculoskeletal:       Left shoulder: He exhibits tenderness. He exhibits normal range of motion, no swelling, no effusion, no crepitus, no deformity, no laceration, no pain, no spasm, normal pulse and normal strength.       Cervical back: Normal.       Thoracic back: Normal.       Lumbar back: He exhibits pain. He exhibits no bony tenderness.       Back:  Negative Empty Can Test ROM intact with Apley Scratch Test No cervical, thoracic, lumbar spine tenderness.  Neurological: He is alert and oriented to person, place, and time. He has normal strength. No cranial nerve deficit. Gait normal. GCS eye subscore is 4. GCS verbal subscore is 5. GCS motor subscore is 6.  Sensation grossly intact.  No pronator drift.  Bilateral heel-knee-shin intact.  Skin: Skin is warm and dry. He is not diaphoretic.    ED Course  Procedures (including critical care time) Medications  oxyCODONE-acetaminophen (PERCOCET/ROXICET) 5-325 MG per tablet 2 tablet (2 tablets Oral Given 06/09/14 0826)    Labs Review Labs Reviewed - No data to display  Imaging Review Dg Lumbar Spine Complete  06/09/2014   CLINICAL DATA:  FALL  EXAM: LUMBAR SPINE - COMPLETE 4+ VIEW  COMPARISON:  09/25/2013  FINDINGS: There is no evidence of lumbar spine fracture. Alignment is normal. Intervertebral disc spaces are maintained.  IMPRESSION: Negative.   Electronically Signed   By: Oley Balm M.D.   On: 06/09/2014 09:17   Dg Shoulder Left  06/09/2014   CLINICAL DATA:  FALL  EXAM: LEFT SHOULDER - 2+ VIEW  COMPARISON:  None.  FINDINGS: There is no  evidence of fracture or dislocation. There is no evidence of arthropathy or other focal bone abnormality. Soft tissues are unremarkable.  IMPRESSION: Negative.   Electronically Signed   By: Oley Balm M.D.   On: 06/09/2014 09:18     EKG Interpretation None      MDM   Final diagnoses:  Bilateral low back pain without sciatica  Left shoulder pain  Fall, initial encounter    Afebrile, NAD, non-toxic appearing, AAOx4. X-rays reviewed without acute abnormality noted.   1) Back pain: Patient with back pain.  No neurological deficits and normal neuro exam.  Patient can walk but states is painful.  No loss of bowel or bladder control.  No concern for cauda equina.  No fever, night sweats, weight loss, h/o cancer, IVDU.  RICE protocol and pain medicine indicated and discussed with patient.   2) Shoulder pain: PE shows no instability, tenderness, or deformity of acromioclavicular and sternoclavicular joints, the cervical spine, glenohumeral joint, coracoid  process, acromion, or scapula. Good shoulder strength during empty can test. Good ROM.   Pain improved while in ED. Return precautions discussed. Patient is agreeable to plan. Patient is stable at time of discharge       Jeannetta Ellis, PA-C 06/09/14 1337

## 2014-07-02 ENCOUNTER — Ambulatory Visit: Payer: Medicare HMO | Attending: Family Medicine

## 2014-07-02 DIAGNOSIS — M25569 Pain in unspecified knee: Secondary | ICD-10-CM | POA: Insufficient documentation

## 2014-07-02 DIAGNOSIS — R262 Difficulty in walking, not elsewhere classified: Secondary | ICD-10-CM | POA: Insufficient documentation

## 2014-07-02 DIAGNOSIS — IMO0001 Reserved for inherently not codable concepts without codable children: Secondary | ICD-10-CM | POA: Diagnosis not present

## 2014-07-15 ENCOUNTER — Ambulatory Visit: Payer: Medicare HMO | Admitting: Physical Therapy

## 2014-07-23 ENCOUNTER — Ambulatory Visit: Payer: Medicare HMO | Attending: Family Medicine | Admitting: Physical Therapy

## 2014-07-23 DIAGNOSIS — M25569 Pain in unspecified knee: Secondary | ICD-10-CM | POA: Insufficient documentation

## 2014-07-23 DIAGNOSIS — IMO0001 Reserved for inherently not codable concepts without codable children: Secondary | ICD-10-CM | POA: Diagnosis present

## 2014-07-23 DIAGNOSIS — R262 Difficulty in walking, not elsewhere classified: Secondary | ICD-10-CM | POA: Diagnosis not present

## 2014-07-25 ENCOUNTER — Encounter: Payer: Medicare HMO | Admitting: Rehabilitation

## 2015-06-06 ENCOUNTER — Encounter (HOSPITAL_COMMUNITY): Payer: Self-pay | Admitting: Emergency Medicine

## 2015-06-06 ENCOUNTER — Emergency Department (INDEPENDENT_AMBULATORY_CARE_PROVIDER_SITE_OTHER)
Admission: EM | Admit: 2015-06-06 | Discharge: 2015-06-06 | Disposition: A | Discharge status: Home / Self Care | Payer: Commercial Managed Care - HMO | Source: Home / Self Care | Attending: Family Medicine | Admitting: Family Medicine

## 2015-06-06 DIAGNOSIS — M25562 Pain in left knee: Secondary | ICD-10-CM | POA: Diagnosis not present

## 2015-06-06 DIAGNOSIS — S86912A Strain of unspecified muscle(s) and tendon(s) at lower leg level, left leg, initial encounter: Secondary | ICD-10-CM

## 2015-06-06 DIAGNOSIS — S86812A Strain of other muscle(s) and tendon(s) at lower leg level, left leg, initial encounter: Secondary | ICD-10-CM

## 2015-06-06 MED ORDER — KETOROLAC TROMETHAMINE 60 MG/2ML IM SOLN
INTRAMUSCULAR | Status: AC
  Filled 2015-06-06: qty 2

## 2015-06-06 MED ORDER — DICLOFENAC POTASSIUM 50 MG PO TABS: 50.0000 mg | ORAL_TABLET | Freq: Three times a day (TID) | ORAL | Status: DC

## 2015-06-06 MED ORDER — KETOROLAC TROMETHAMINE 60 MG/2ML IM SOLN
60.0000 mg | Freq: Once | INTRAMUSCULAR | Status: AC
  Administered 2015-06-06: 60 mg via INTRAMUSCULAR

## 2015-06-06 NOTE — Discharge Instructions (Signed)
Knee Pain Ice, elevation, wear knee splint, Cataflam 50 mg for pain as needed. Toradol 60 mg IM in office The knee is the complex joint between your thigh and your lower leg. It is made up of bones, tendons, ligaments, and cartilage. The bones that make up the knee are:  The femur in the thigh.  The tibia and fibula in the lower leg.  The patella or kneecap riding in the groove on the lower femur. CAUSES  Knee pain is a common complaint with many causes. A few of these causes are:  Injury, such as:  A ruptured ligament or tendon injury.  Torn cartilage.  Medical conditions, such as:  Gout  Arthritis  Infections  Overuse, over training, or overdoing a physical activity. Knee pain can be minor or severe. Knee pain can accompany debilitating injury. Minor knee problems often respond well to self-care measures or get well on their own. More serious injuries may need medical intervention or even surgery. SYMPTOMS The knee is complex. Symptoms of knee problems can vary widely. Some of the problems are:  Pain with movement and weight bearing.  Swelling and tenderness.  Buckling of the knee.  Inability to straighten or extend your knee.  Your knee locks and you cannot straighten it.  Warmth and redness with pain and fever.  Deformity or dislocation of the kneecap. DIAGNOSIS  Determining what is wrong may be very straight forward such as when there is an injury. It can also be challenging because of the complexity of the knee. Tests to make a diagnosis may include:  Your caregiver taking a history and doing a physical exam.  Routine X-rays can be used to rule out other problems. X-rays will not reveal a cartilage tear. Some injuries of the knee can be diagnosed by:  Arthroscopy a surgical technique by which a small video camera is inserted through tiny incisions on the sides of the knee. This procedure is used to examine and repair internal knee joint problems. Tiny  instruments can be used during arthroscopy to repair the torn knee cartilage (meniscus).  Arthrography is a radiology technique. A contrast liquid is directly injected into the knee joint. Internal structures of the knee joint then become visible on X-ray film.  An MRI scan is a non X-ray radiology procedure in which magnetic fields and a computer produce two- or three-dimensional images of the inside of the knee. Cartilage tears are often visible using an MRI scanner. MRI scans have largely replaced arthrography in diagnosing cartilage tears of the knee.  Blood work.  Examination of the fluid that helps to lubricate the knee joint (synovial fluid). This is done by taking a sample out using a needle and a syringe. TREATMENT The treatment of knee problems depends on the cause. Some of these treatments are:  Depending on the injury, proper casting, splinting, surgery, or physical therapy care will be needed.  Give yourself adequate recovery time. Do not overuse your joints. If you begin to get sore during workout routines, back off. Slow down or do fewer repetitions.  For repetitive activities such as cycling or running, maintain your strength and nutrition.  Alternate muscle groups. For example, if you are a weight lifter, work the upper body on one day and the lower body the next.  Either tight or weak muscles do not give the proper support for your knee. Tight or weak muscles do not absorb the stress placed on the knee joint. Keep the muscles surrounding the knee strong.  Take care of mechanical problems.  If you have flat feet, orthotics or special shoes may help. See your caregiver if you need help.  Arch supports, sometimes with wedges on the inner or outer aspect of the heel, can help. These can shift pressure away from the side of the knee most bothered by osteoarthritis.  A brace called an "unloader" brace also may be used to help ease the pressure on the most arthritic side of the  knee.  If your caregiver has prescribed crutches, braces, wraps or ice, use as directed. The acronym for this is PRICE. This means protection, rest, ice, compression, and elevation.  Nonsteroidal anti-inflammatory drugs (NSAIDs), can help relieve pain. But if taken immediately after an injury, they may actually increase swelling. Take NSAIDs with food in your stomach. Stop them if you develop stomach problems. Do not take these if you have a history of ulcers, stomach pain, or bleeding from the bowel. Do not take without your caregiver's approval if you have problems with fluid retention, heart failure, or kidney problems.  For ongoing knee problems, physical therapy may be helpful.  Glucosamine and chondroitin are over-the-counter dietary supplements. Both may help relieve the pain of osteoarthritis in the knee. These medicines are different from the usual anti-inflammatory drugs. Glucosamine may decrease the rate of cartilage destruction.  Injections of a corticosteroid drug into your knee joint may help reduce the symptoms of an arthritis flare-up. They may provide pain relief that lasts a few months. You may have to wait a few months between injections. The injections do have a small increased risk of infection, water retention, and elevated blood sugar levels.  Hyaluronic acid injected into damaged joints may ease pain and provide lubrication. These injections may work by reducing inflammation. A series of shots may give relief for as long as 6 months.  Topical painkillers. Applying certain ointments to your skin may help relieve the pain and stiffness of osteoarthritis. Ask your pharmacist for suggestions. Many over the-counter products are approved for temporary relief of arthritis pain.  In some countries, doctors often prescribe topical NSAIDs for relief of chronic conditions such as arthritis and tendinitis. A review of treatment with NSAID creams found that they worked as well as oral  medications but without the serious side effects. PREVENTION  Maintain a healthy weight. Extra pounds put more strain on your joints.  Get strong, stay limber. Weak muscles are a common cause of knee injuries. Stretching is important. Include flexibility exercises in your workouts.  Be smart about exercise. If you have osteoarthritis, chronic knee pain or recurring injuries, you may need to change the way you exercise. This does not mean you have to stop being active. If your knees ache after jogging or playing basketball, consider switching to swimming, water aerobics, or other low-impact activities, at least for a few days a week. Sometimes limiting high-impact activities will provide relief.  Make sure your shoes fit well. Choose footwear that is right for your sport.  Protect your knees. Use the proper gear for knee-sensitive activities. Use kneepads when playing volleyball or laying carpet. Buckle your seat belt every time you drive. Most shattered kneecaps occur in car accidents.  Rest when you are tired. SEEK MEDICAL CARE IF:  You have knee pain that is continual and does not seem to be getting better.  SEEK IMMEDIATE MEDICAL CARE IF:  Your knee joint feels hot to the touch and you have a high fever. MAKE SURE YOU:   Understand  these instructions.  Will watch your condition.  Will get help right away if you are not doing well or get worse. Document Released: 08/29/2007 Document Revised: 01/24/2012 Document Reviewed: 08/29/2007 [facility] Patient Information 2015 [place], [place]. This information is not intended to replace advice given to you by your health care provider. Make sure you discuss any questions you have with your health care provider.  Knee Sprain A knee sprain is a tear in the strong bands of tissue that connect the bones (ligaments) of your knee. HOME CARE  Raise (elevate) your injured knee to lessen puffiness (swelling).  To ease pain and puffiness, put ice on  the injured area.  Put ice in a plastic bag.  Place a towel between your skin and the bag.  Leave the ice on for 20 minutes, 2-3 times a day.  Only take medicine as told by your doctor.  Do not leave your knee unprotected until pain and stiffness go away (usually 4-6 weeks).  If you have a cast or splint, do not get it wet. If your doctor told you to not take it off, cover it with a plastic bag when you shower or bathe. Do not swim.  Your doctor may have you do exercises to prevent or limit permanent weakness and stiffness. GET HELP RIGHT AWAY IF:   Your cast or splint becomes damaged.  Your pain gets worse.  You have a lot of pain, puffiness, or numbness below the cast or splint. MAKE SURE YOU:   Understand these instructions.  Will watch your condition.  Will get help right away if you are not doing well or get worse. Document Released: 10/20/2009 Document Revised: 11/06/2013 Document Reviewed: 07/10/2013 [facility] Patient Information 2015 [place], [place]. This information is not intended to replace advice given to you by your health care provider. Make sure you discuss any questions you have with your health care provider.

## 2015-06-06 NOTE — ED Provider Notes (Signed)
CSN: [id number]     Arrival date & time 06/06/15  1809 History   First MD Initiated Contact with Patient 06/06/15 1821     Chief Complaint  Patient presents with  . 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 He states since he was a young child he has had pain in the sneeze particularly the left knee. He is been to orthopedist however he has never received a diagnosis from them. Yesterday he went swimming as he usually does once or twice a week. Otherwise he has had no trauma to the knee. No excessive walking, falling or twisting of the knee. He is complaining primarily of anterior, medial and lateral knee pain. He also feels tightness in the hamstring muscles and lesser to the calf muscles.   Past Medical History  Diagnosis Date  . Hypertension   . Hyperlipemia   . Bipolar 1 disorder   . Schizoaffective disorder    History reviewed. No pertinent past surgical history. No family history on file. History  Substance Use Topics  . Smoking status: Current Every Day Smoker  . Smokeless tobacco: Not on file  . Alcohol Use: Yes    Review of Systems  Constitutional: Negative.   Respiratory: Negative.   Gastrointestinal: Negative.   Genitourinary: Negative.   Musculoskeletal: Negative for back pain and neck pain.       As per HPI  Skin: Negative.     Allergies  Review of patient's allergies indicates no known allergies.  Home Medications   Prior to Admission medications   Medication Sig Start Date End Date Taking? Authorizing Provider  OVER THE COUNTER MEDICATION estrogen   Yes Historical Provider, MD  albuterol (PROVENTIL HFA;VENTOLIN HFA) 108 (90 BASE) MCG/ACT inhaler Inhale 1-2 puffs into the lungs every 6 (six) hours as needed for wheezing or shortness of breath. 02/21/14   Mathis Fare Presson, PA   amLODipine (NORVASC) 10 MG tablet Take 10 mg by mouth daily.    Historical Provider, MD  benztropine (COGENTIN) 2 MG tablet Take 2 mg by mouth daily.    Historical Provider, MD  carbamazepine (TEGRETOL) 100 MG chewable tablet Chew 200 mg by mouth at bedtime.    Historical Provider, MD  diclofenac (CATAFLAM) 50 MG tablet Take 1 tablet (50 mg total) by mouth 3 (three) times daily. One tablet TID with food prn pain. 06/06/15   Hayden Rasmussen, NP  furosemide (LASIX) 20 MG tablet Take 20 mg by mouth daily.    Historical Provider, MD  ibuprofen (ADVIL,MOTRIN) 800 MG tablet Take 1 tablet (800 mg total) by mouth 3 (three) times daily. 06/09/14   Jennifer Piepenbrink, PA-C  lisinopril (PRINIVIL,ZESTRIL) 10 MG tablet Take 10 mg by mouth 2 (two) times daily. 05/14/14   Historical Provider, MD  oxyCODONE-acetaminophen (PERCOCET/ROXICET) 5-325 MG per tablet Take 1-2 tablets by mouth every 6 (six) hours as needed for severe pain. May take 2 tablets PO q 6 hours for severe pain - Do not take with Tylenol as this tablet already contains tylenol 06/09/14   Francee Piccolo, PA-C  pravastatin (PRAVACHOL) 10 MG tablet Take 10 mg by mouth daily.    Historical Provider, MD  SERTRALINE HCL PO Take 2 tablets by mouth daily.    Historical Provider, MD  Tetrahydrozoline HCl (VISINE OP) Place 2 drops into both eyes daily as needed (for dry eyes).    Historical Provider, MD  ziprasidone (GEODON) 80 MG capsule Take 80 mg by mouth 2 (two) times daily with a meal.    Historical Provider, MD   BP 129/81 mmHg  Pulse 80  Temp(Src) 98.3 F (36.8 C) (Oral)  Resp 16  SpO2 96% Physical Exam  Constitutional: He is oriented to person, place, and time. He appears well-developed and well-nourished. No distress.  HENT:  Head: Normocephalic and atraumatic.  Eyes: EOM are normal. Left eye exhibits no discharge.  Neck: Normal range of motion.  Pulmonary/Chest: Effort normal. No respiratory distress.  Musculoskeletal:  Minor left  anterior knee swelling.  Marked tenderness to the antero-lat-med knee. Patella glides smoothly and without pain and nontender. Tender to the hamstring tendons in the popliteal fossa.  Unable to flex the knee without assistance. Extension to 180 deg. No laxity with drawer testing but produces anterior pain. Varus valgus not tested due to pt's apprehension of pain. No deformity. No calf swelling.   Neurological: He is alert and oriented to person, place, and time. No cranial nerve deficit.  Skin: Skin is warm and dry.  Psychiatric: He has a normal mood and affect.  Nursing note and vitals reviewed.   ED Course  Procedures (including critical care time) Labs Review Labs Reviewed - No data to display  Imaging Review No results found.   MDM   1. Left knee pain   2. Knee strain, left, initial encounter    knee pain is most likely due to a combination of his underlying chronic knee pain associated with his swimming yesterday. He may have just overdone it and strained some of his ligaments and tendons. He denies any trauma as stated above. No force has been applied to the knee, no fall or blunt injury. Ice, elevation, wear knee splint, Cataflam 50 mg for pain as needed. Toradol 60 mg IM in office     Hayden Rasmussen, NP 06/06/15 1913

## 2015-06-06 NOTE — ED Notes (Signed)
Reports left knee pain that started today.  Patient also has swelling in left lower leg.  No known injury.  Left knee is painful particularly with walking.

## 2015-10-06 ENCOUNTER — Emergency Department (HOSPITAL_COMMUNITY)
Admission: EM | Admit: 2015-10-06 | Discharge: 2015-10-06 | Disposition: A | Discharge status: Home / Self Care | Payer: Commercial Managed Care - HMO | Attending: Emergency Medicine | Admitting: Emergency Medicine

## 2015-10-06 ENCOUNTER — Encounter (HOSPITAL_COMMUNITY): Payer: Self-pay | Admitting: *Deleted

## 2015-10-06 DIAGNOSIS — M25561 Pain in right knee: Secondary | ICD-10-CM | POA: Diagnosis not present

## 2015-10-06 DIAGNOSIS — F259 Schizoaffective disorder, unspecified: Secondary | ICD-10-CM | POA: Insufficient documentation

## 2015-10-06 DIAGNOSIS — I10 Essential (primary) hypertension: Secondary | ICD-10-CM | POA: Insufficient documentation

## 2015-10-06 DIAGNOSIS — Z791 Long term (current) use of non-steroidal anti-inflammatories (NSAID): Secondary | ICD-10-CM | POA: Insufficient documentation

## 2015-10-06 DIAGNOSIS — F319 Bipolar disorder, unspecified: Secondary | ICD-10-CM | POA: Diagnosis not present

## 2015-10-06 DIAGNOSIS — M25562 Pain in left knee: Secondary | ICD-10-CM | POA: Insufficient documentation

## 2015-10-06 DIAGNOSIS — E785 Hyperlipidemia, unspecified: Secondary | ICD-10-CM | POA: Insufficient documentation

## 2015-10-06 DIAGNOSIS — Z79899 Other long term (current) drug therapy: Secondary | ICD-10-CM | POA: Diagnosis not present

## 2015-10-06 DIAGNOSIS — F17210 Nicotine dependence, cigarettes, uncomplicated: Secondary | ICD-10-CM | POA: Diagnosis not present

## 2015-10-06 DIAGNOSIS — G8929 Other chronic pain: Secondary | ICD-10-CM | POA: Insufficient documentation

## 2015-10-06 DIAGNOSIS — M25569 Pain in unspecified knee: Secondary | ICD-10-CM

## 2015-10-06 MED ORDER — ACETAMINOPHEN 500 MG PO TABS
1000.0000 mg | ORAL_TABLET | Freq: Once | ORAL | Status: AC
  Administered 2015-10-06: 1000 mg via ORAL
  Filled 2015-10-06: qty 2

## 2015-10-06 MED ORDER — TIZANIDINE HCL 4 MG PO TABS: 4.0000 mg | ORAL_TABLET | Freq: Four times a day (QID) | ORAL | Status: DC | PRN

## 2015-10-06 NOTE — ED Notes (Signed)
PA at bedside.

## 2015-10-06 NOTE — ED Notes (Signed)
Pt states pain in left knee for the last 5 years.  Pt states the pain in affecting his job and "no one will give me this good pain killers to help."  Pt states he has been seen several time over the last 5 years and is ready to have his leg amputated due to the pain.  No other medical problems or complaints at this time.

## 2015-10-06 NOTE — Discharge Instructions (Signed)
Take acetaminophen (Tylenol) up to 975 mg (this is normally 3 over-the-counter pills) up to 3 times a day. Do not drink alcohol. Make sure your other medications do not contain acetaminophen (Read the labels!) Do not drive or perform critical tasks while you're taking the Zanaflex: It will make you drowsy  Please follow with your primary care doctor in the next 2 days for a check-up. They must obtain records for further management.   Do not hesitate to return to the Emergency Department for any new, worsening or concerning symptoms.

## 2015-10-06 NOTE — ED Provider Notes (Signed)
CSN: [id number]     Arrival date & time 10/06/15  0802 History   First MD Initiated Contact with Patient 10/06/15 [phone]     Chief Complaint  Patient presents with  . Knee Pain     (Consider location/radiation/quality/duration/timing/severity/associated sxs/prior Treatment) HPI   Blood pressure 145/90, pulse 89, temperature 97.3 F (36.3 C), temperature source Oral, resp. rate 20, SpO2 100 %.  Kenneth Wilson is a 39 y.o. male complaining of lateral knee pain worse on the left and the right worsening over the course of 5 years. Patient was seen in orthopedist but this orthopedist retired, he did not recommend another one to follow-up with. Patient is taking an unknown pain medication at home and was told not to mix it with ibuprofen. No recent trauma, pain is exacerbated by weightbearing. Pain is rated at 8 out of 10 and described as aching and throbbing.  Past Medical History  Diagnosis Date  . Hypertension   . Hyperlipemia   . Bipolar 1 disorder (HCC)   . Schizoaffective disorder (HCC)    History reviewed. No pertinent past surgical history. History reviewed. No pertinent family history. Social History  Substance Use Topics  . Smoking status: Current Every Day Smoker  . Smokeless tobacco: None  . Alcohol Use: Yes    Review of Systems  10 systems reviewed and found to be negative, except as noted in the HPI.   Allergies  Review of patient's allergies indicates no known allergies.  Home Medications   Prior to Admission medications   Medication Sig Start Date End Date Taking? Authorizing Provider  albuterol (PROVENTIL HFA;VENTOLIN HFA) 108 (90 BASE) MCG/ACT inhaler Inhale 1-2 puffs into the lungs every 6 (six) hours as needed for wheezing or shortness of breath. 02/21/14   Mathis FareJennifer Lee H Presson, PA  amLODipine (NORVASC) 10 MG tablet Take 10 mg by mouth daily.    Historical Provider, MD  benztropine (COGENTIN) 2 MG tablet Take 2 mg by mouth daily.    Historical Provider, MD   carbamazepine (TEGRETOL) 100 MG chewable tablet Chew 200 mg by mouth at bedtime.    Historical Provider, MD  diclofenac (CATAFLAM) 50 MG tablet Take 1 tablet (50 mg total) by mouth 3 (three) times daily. One tablet TID with food prn pain. 06/06/15   Hayden Rasmussenavid Mabe, NP  furosemide (LASIX) 20 MG tablet Take 20 mg by mouth daily.    Historical Provider, MD  ibuprofen (ADVIL,MOTRIN) 800 MG tablet Take 1 tablet (800 mg total) by mouth 3 (three) times daily. 06/09/14   Jennifer Piepenbrink, PA-C  lisinopril (PRINIVIL,ZESTRIL) 10 MG tablet Take 10 mg by mouth 2 (two) times daily. 05/14/14   Historical Provider, MD  OVER THE COUNTER MEDICATION estrogen    Historical Provider, MD  oxyCODONE-acetaminophen (PERCOCET/ROXICET) 5-325 MG per tablet Take 1-2 tablets by mouth every 6 (six) hours as needed for severe pain. May take 2 tablets PO q 6 hours for severe pain - Do not take with Tylenol as this tablet already contains tylenol 06/09/14   Francee PiccoloJennifer Piepenbrink, PA-C  pravastatin (PRAVACHOL) 10 MG tablet Take 10 mg by mouth daily.    Historical Provider, MD  SERTRALINE HCL PO Take 2 tablets by mouth daily.    Historical Provider, MD  Tetrahydrozoline HCl (VISINE OP) Place 2 drops into both eyes daily as needed (for dry eyes).    Historical Provider, MD  ziprasidone (GEODON) 80 MG capsule Take 80 mg by mouth 2 (two) times daily with a meal.  Historical Provider, MD   BP 145/90 mmHg  Pulse 89  Temp(Src) 97.3 F (36.3 C) (Oral)  Resp 20  SpO2 100% Physical Exam  Constitutional: He is oriented to person, place, and time. He appears well-developed and well-nourished. No distress.  HENT:  Head: Normocephalic.  Eyes: Conjunctivae and EOM are normal.  Cardiovascular: Normal rate and regular rhythm.   Pulmonary/Chest: Effort normal and breath sounds normal. No stridor.  Abdominal: Soft. Bowel sounds are normal.  Musculoskeletal: Normal range of motion.  Left knee:  No deformity, erythema or abrasions. FROM. No  effusion +++crepitance. Anterior and posterior drawer show no abnormal laxity. Stable to valgus and varus stress. Joint lines are non-tender. Neurovascularly intact. Pt ambulates with non-antalgic gait.    Neurological: He is alert and oriented to person, place, and time.  Psychiatric: He has a normal mood and affect.  Nursing note and vitals reviewed.   ED Course  Procedures (including critical care time) Labs Review Labs Reviewed - No data to display  Imaging Review No results found. I have personally reviewed and evaluated these images and lab results as part of my medical decision-making.   EKG Interpretation None      MDM   Final diagnoses:  Chronic knee pain, unspecified laterality    Filed Vitals:   10/06/15 0810  BP: 145/90  Pulse: 89  Temp: 97.3 F (36.3 C)  TempSrc: Oral  Resp: 20  SpO2: 100%    Medications  acetaminophen (TYLENOL) tablet 1,000 mg (1,000 mg Oral Given 10/06/15 0836)    Kenneth Wilson is 39 y.o. male presenting with exacerbation of his chronic knee pain. Patient is given referral to orthopedics. I've encouraged him to ask his primary care to refer him to a pain management clinic. No trauma, neurovascularly intact, no indication for emergent intervention at this time.   Evaluation does not show pathology that would require ongoing emergent intervention or inpatient treatment. Pt is hemodynamically stable and mentating appropriately. Discussed findings and plan with patient/guardian, who agrees with care plan. All questions answered. Return precautions discussed and outpatient follow up given.   Discharge Medication List as of 10/06/2015  8:27 AM    START taking these medications   Details  tiZANidine (ZANAFLEX) 4 MG tablet Take 1 tablet (4 mg total) by mouth every 6 (six) hours as needed for muscle spasms., Starting 10/06/2015, Until Discontinued, Print             Wynetta Emery, PA-C 10/06/15 0930  Melene Plan, DO 10/06/15  1044

## 2016-02-24 ENCOUNTER — Emergency Department (HOSPITAL_COMMUNITY)
Admission: EM | Admit: 2016-02-24 | Discharge: 2016-02-24 | Disposition: A | Discharge status: Home / Self Care | Payer: Commercial Managed Care - HMO | Attending: Emergency Medicine | Admitting: Emergency Medicine

## 2016-02-24 ENCOUNTER — Encounter (HOSPITAL_COMMUNITY): Payer: Self-pay | Admitting: Emergency Medicine

## 2016-02-24 DIAGNOSIS — E785 Hyperlipidemia, unspecified: Secondary | ICD-10-CM | POA: Diagnosis not present

## 2016-02-24 DIAGNOSIS — B029 Zoster without complications: Secondary | ICD-10-CM | POA: Insufficient documentation

## 2016-02-24 DIAGNOSIS — Z791 Long term (current) use of non-steroidal anti-inflammatories (NSAID): Secondary | ICD-10-CM | POA: Diagnosis not present

## 2016-02-24 DIAGNOSIS — F319 Bipolar disorder, unspecified: Secondary | ICD-10-CM | POA: Diagnosis not present

## 2016-02-24 DIAGNOSIS — R21 Rash and other nonspecific skin eruption: Secondary | ICD-10-CM | POA: Diagnosis present

## 2016-02-24 DIAGNOSIS — Z79899 Other long term (current) drug therapy: Secondary | ICD-10-CM | POA: Diagnosis not present

## 2016-02-24 DIAGNOSIS — F259 Schizoaffective disorder, unspecified: Secondary | ICD-10-CM | POA: Insufficient documentation

## 2016-02-24 DIAGNOSIS — I10 Essential (primary) hypertension: Secondary | ICD-10-CM | POA: Diagnosis not present

## 2016-02-24 DIAGNOSIS — F17210 Nicotine dependence, cigarettes, uncomplicated: Secondary | ICD-10-CM | POA: Diagnosis not present

## 2016-02-24 MED ORDER — PERCOCET 5-325 MG PO TABS: 1.0000 | ORAL_TABLET | Freq: Four times a day (QID) | ORAL | Status: DC | PRN

## 2016-02-24 MED ORDER — VALACYCLOVIR HCL 1 G PO TABS: 1000.0000 mg | ORAL_TABLET | Freq: Two times a day (BID) | ORAL | Status: DC

## 2016-02-24 NOTE — ED Provider Notes (Signed)
CSN: [id number]     Arrival date & time 02/24/16  1018 History  By signing my name below, I, Kenneth Wilson, attest that this documentation has been prepared under the direction and in the presence of Charlestine Night, PA-C Electronically Signed: Charline Bills, ED Scribe 02/24/2016 at 11:22 AM.   Chief Complaint  Patient presents with  . Rash   The history is provided by the patient. No language interpreter was used.   HPI Comments: Kenneth Wilson is a 40 y.o. male who presents to the Emergency Department complaining of a gradually worsening painful and pruritic rash to the right abdomen first noticed 3 days ago. No topical ointments tried PTA. No other symptoms reported at this visit.   Past Medical History  Diagnosis Date  . Hypertension   . Hyperlipemia   . Bipolar 1 disorder (HCC)   . Schizoaffective disorder (HCC)    History reviewed. No pertinent past surgical history. No family history on file. Social History  Substance Use Topics  . Smoking status: Current Every Day Smoker -- 0.02 packs/day    Types: Cigarettes  . Smokeless tobacco: None  . Alcohol Use: Yes     Comment: socially     Review of Systems  A complete 10 system review of systems was obtained and all systems are negative except as noted in the HPI and PMH.   Allergies  Review of patient's allergies indicates no known allergies.  Home Medications   Prior to Admission medications   Medication Sig Start Date End Date Taking? Authorizing Provider  albuterol (PROVENTIL HFA;VENTOLIN HFA) 108 (90 BASE) MCG/ACT inhaler Inhale 1-2 puffs into the lungs every 6 (six) hours as needed for wheezing or shortness of breath. 02/21/14  Yes Mathis Fare Presson, PA  amLODipine (NORVASC) 10 MG tablet Take 10 mg by mouth daily.   Yes Historical Provider, MD  benztropine (COGENTIN) 2 MG tablet Take 2 mg by mouth daily.   Yes Historical Provider, MD  carbamazepine (TEGRETOL) 100 MG chewable tablet Chew 200 mg by mouth at  bedtime.   Yes Historical Provider, MD  lisinopril (PRINIVIL,ZESTRIL) 10 MG tablet Take 10 mg by mouth 2 (two) times daily. 05/14/14  Yes Historical Provider, MD  OVER THE COUNTER MEDICATION estrogen   Yes Historical Provider, MD  oxyCODONE-acetaminophen (PERCOCET/ROXICET) 5-325 MG per tablet Take 1-2 tablets by mouth every 6 (six) hours as needed for severe pain. May take 2 tablets PO q 6 hours for severe pain - Do not take with Tylenol as this tablet already contains tylenol 06/09/14  Yes Jennifer Piepenbrink, PA-C  pravastatin (PRAVACHOL) 10 MG tablet Take 10 mg by mouth daily.   Yes Historical Provider, MD  SERTRALINE HCL PO Take 2 tablets by mouth daily.   Yes Historical Provider, MD  Tetrahydrozoline HCl (VISINE OP) Place 2 drops into both eyes daily as needed (for dry eyes).   Yes Historical Provider, MD  ziprasidone (GEODON) 80 MG capsule Take 80 mg by mouth 2 (two) times daily with a meal.   Yes Historical Provider, MD  diclofenac (CATAFLAM) 50 MG tablet Take 1 tablet (50 mg total) by mouth 3 (three) times daily. One tablet TID with food prn pain. 06/06/15   Hayden Rasmussen, NP  furosemide (LASIX) 20 MG tablet Take 20 mg by mouth daily.    Historical Provider, MD  ibuprofen (ADVIL,MOTRIN) 800 MG tablet Take 1 tablet (800 mg total) by mouth 3 (three) times daily. 06/09/14   Jennifer Piepenbrink, PA-C  tiZANidine (ZANAFLEX)  4 MG tablet Take 1 tablet (4 mg total) by mouth every 6 (six) hours as needed for muscle spasms. 10/06/15   Nicole Pisciotta, PA-C   BP 178/94 mmHg  Pulse 68  Temp(Src) 98.2 F (36.8 C) (Oral)  Resp 18  SpO2 98% Physical Exam  Constitutional: He is oriented to person, place, and time. He appears well-developed and well-nourished. No distress.  HENT:  Head: Normocephalic and atraumatic.  Eyes: Conjunctivae and EOM are normal.  Neck: Neck supple. No tracheal deviation present.  Cardiovascular: Normal rate.   Pulmonary/Chest: Effort normal. No respiratory distress.   Musculoskeletal: Normal range of motion.  Neurological: He is alert and oriented to person, place, and time.  Skin: Skin is warm and dry. Rash noted. Rash is vesicular.  Vesicular rash in the t-10 dermatone and t-12 dermatone on the R.   Psychiatric: He has a normal mood and affect. His behavior is normal.  Nursing note and vitals reviewed.  ED Course  Procedures (including critical care time) DIAGNOSTIC STUDIES: Oxygen Saturation is 98% on RA, normal by my interpretation.    COORDINATION OF CARE: 11:16 AM-Discussed treatment plan with pt at bedside and pt agreed to plan.   Patient be treated for herpes zoster.  Told to return here as needed.  Patient is advised follow-up with his primary care doctor   Charlestine NightChristopher Ubah Radke, PA-C 02/24/16 1123  Derwood KaplanAnkit Nanavati, MD 02/25/16 0725

## 2016-02-24 NOTE — ED Notes (Signed)
Patient states rash came up on torso x 3 days ago and has spread.   Patient states the area does hurt with itching.  Patient denies other symptoms.   Blistered areas to torso.

## 2016-02-24 NOTE — Discharge Instructions (Signed)
Return here as needed.  Follow-up with your primary care doctor °

## 2016-02-26 ENCOUNTER — Ambulatory Visit (HOSPITAL_COMMUNITY)
Admission: EM | Admit: 2016-02-26 | Discharge: 2016-02-26 | Disposition: A | Discharge status: Home / Self Care | Payer: Commercial Managed Care - HMO | Attending: Family Medicine | Admitting: Family Medicine

## 2016-02-26 ENCOUNTER — Encounter (HOSPITAL_COMMUNITY): Payer: Self-pay

## 2016-02-26 DIAGNOSIS — B029 Zoster without complications: Secondary | ICD-10-CM

## 2016-02-26 MED ORDER — GABAPENTIN 300 MG PO CAPS: 300.0000 mg | ORAL_CAPSULE | Freq: Three times a day (TID) | ORAL | Status: DC

## 2016-02-26 NOTE — ED Notes (Signed)
Patient discharged by Frank Patrick, PA 

## 2016-02-26 NOTE — ED Notes (Signed)
Patient has been diagnosed with Shingles at the ED on 02/24/2016 and is here today for a follow up and just wants to make sure everything is ok No acute distress

## 2016-02-26 NOTE — ED Provider Notes (Signed)
CSN: [id number]     Arrival date & time 02/26/16  1610 History   First MD Initiated Contact with Patient 02/26/16 1740     Chief Complaint  Patient presents with  . Herpes Zoster   (Consider location/radiation/quality/duration/timing/severity/associated sxs/prior Treatment) HPI  pt states that he was seen and dx with shingles, has percocet at home but not helping with discomfort. States that it hurts to take a deep breath.  Past Medical History  Diagnosis Date  . Hypertension   . Hyperlipemia   . Bipolar 1 disorder (HCC)   . Schizoaffective disorder (HCC)    History reviewed. No pertinent past surgical history. No family history on file. Social History  Substance Use Topics  . Smoking status: Current Every Day Smoker -- 0.02 packs/day    Types: Cigarettes  . Smokeless tobacco: None  . Alcohol Use: Yes     Comment: socially    Review of Systems shingles Allergies  Review of patient's allergies indicates no known allergies.  Home Medications   Prior to Admission medications   Medication Sig Start Date End Date Taking? Authorizing Provider  albuterol (PROVENTIL HFA;VENTOLIN HFA) 108 (90 BASE) MCG/ACT inhaler Inhale 1-2 puffs into the lungs every 6 (six) hours as needed for wheezing or shortness of breath. 02/21/14  Yes Mathis FareJennifer Lee H Presson, PA  amLODipine (NORVASC) 10 MG tablet Take 10 mg by mouth daily.   Yes Historical Provider, MD  benztropine (COGENTIN) 2 MG tablet Take 2 mg by mouth daily.   Yes Historical Provider, MD  carbamazepine (TEGRETOL) 100 MG chewable tablet Chew 200 mg by mouth at bedtime.   Yes Historical Provider, MD  lisinopril (PRINIVIL,ZESTRIL) 10 MG tablet Take 10 mg by mouth 2 (two) times daily. 05/14/14  Yes Historical Provider, MD  oxyCODONE-acetaminophen (PERCOCET/ROXICET) 5-325 MG per tablet Take 1-2 tablets by mouth every 6 (six) hours as needed for severe pain. May take 2 tablets PO q 6 hours for severe pain - Do not take with Tylenol as this tablet  already contains tylenol 06/09/14  Yes Jennifer Piepenbrink, PA-C  pravastatin (PRAVACHOL) 10 MG tablet Take 10 mg by mouth daily.   Yes Historical Provider, MD  SERTRALINE HCL PO Take 2 tablets by mouth daily.   Yes Historical Provider, MD  valACYclovir (VALTREX) 1000 MG tablet Take 1 tablet (1,000 mg total) by mouth 2 (two) times daily. 02/24/16  Yes Christopher Lawyer, PA-C  diclofenac (CATAFLAM) 50 MG tablet Take 1 tablet (50 mg total) by mouth 3 (three) times daily. One tablet TID with food prn pain. 06/06/15   Hayden Rasmussenavid Mabe, NP  furosemide (LASIX) 20 MG tablet Take 20 mg by mouth daily.    Historical Provider, MD  gabapentin (NEURONTIN) 300 MG capsule Take 1 capsule (300 mg total) by mouth 3 (three) times daily. 02/26/16   Tharon AquasFrank C Jaeline Whobrey, PA  ibuprofen (ADVIL,MOTRIN) 800 MG tablet Take 1 tablet (800 mg total) by mouth 3 (three) times daily. 06/09/14   Francee PiccoloJennifer Piepenbrink, PA-C  OVER THE COUNTER MEDICATION estrogen    Historical Provider, MD  PERCOCET 5-325 MG tablet Take 1 tablet by mouth every 6 (six) hours as needed for severe pain. 02/24/16   Charlestine Nighthristopher Lawyer, PA-C  Tetrahydrozoline HCl (VISINE OP) Place 2 drops into both eyes daily as needed (for dry eyes).    Historical Provider, MD  tiZANidine (ZANAFLEX) 4 MG tablet Take 1 tablet (4 mg total) by mouth every 6 (six) hours as needed for muscle spasms. 10/06/15   Wynetta EmeryNicole Pisciotta, PA-C  ziprasidone (GEODON) 80 MG capsule Take 80 mg by mouth 2 (two) times daily with a meal.    Historical Provider, MD   Meds Ordered and Administered this Visit  Medications - No data to display  BP 134/81 mmHg  Pulse 60  Temp(Src) 98.1 F (36.7 C) (Oral)  Resp 18  SpO2 98% No data found.   Physical Exam NURSES NOTES AND VITAL SIGNS REVIEWED. CONSTITUTIONAL: Well developed, well nourished, no acute distress HEENT: normocephalic, atraumatic EYES: Conjunctiva normal NECK:normal ROM, supple, no adenopathy PULMONARY:No respiratory distress, normal  effort MUSCULOSKELETAL: Normal ROM of all extremities,  SKIN: warm and dry without rash PSYCHIATRIC: Mood and affect, behavior are normal ABDOMEN: zoster outbreak noted   ED Course  Procedures (including critical care time)  Labs Review Labs Reviewed - No data to display  Imaging Review No results found.   Visual Acuity Review  Right Eye Distance:   Left Eye Distance:   Bilateral Distance:    Right Eye Near:   Left Eye Near:    Bilateral Near:      [id number]     MDM   1. Shingles outbreak     Patient is reassured that there are no issues that require transfer to higher level of care at this time or additional tests. Patient is advised to continue home symptomatic treatment. Patient is advised that if there are new or worsening symptoms to attend the emergency department, contact primary care provider, or return to [facility]. Instructions of care provided discharged home in stable condition.    THIS NOTE WAS GENERATED USING A VOICE RECOGNITION SOFTWARE PROGRAM. ALL REASONABLE EFFORTS  WERE MADE TO PROOFREAD THIS DOCUMENT FOR ACCURACY.  I have verbally reviewed the discharge instructions with the patient. A printed AVS was given to the patient.  All questions were answered prior to discharge.      Tharon Aquas, PA 02/26/16 [phone]

## 2016-02-26 NOTE — Discharge Instructions (Signed)
Shingles °Shingles is an infection that causes a painful skin rash and fluid-filled blisters. Shingles is caused by the same virus that causes chickenpox. °Shingles only develops in people who: °· Have had chickenpox. °· Have gotten the chickenpox vaccine. (This is rare.) °The first symptoms of shingles may be itching, tingling, or pain in an area on your skin. A rash will follow in a few days or weeks. The rash is usually on one side of the body in a bandlike or beltlike pattern. Over time, the rash turns into fluid-filled blisters that break open, scab over, and dry up. Medicines may: °· Help you manage pain. °· Help you recover more quickly. °· Help to prevent long-term problems. °HOME CARE °Medicines  °· Take medicines only as told by your doctor. °· Apply an anti-itch or numbing cream to the affected area as told by your doctor. °Blister and Rash Care  °· Take a cool bath or put cool compresses on the area of the rash or blisters as told by your doctor. This may help with pain and itching. °· Keep your rash covered with a loose bandage (dressing). Wear loose-fitting clothing. °· Keep your rash and blisters clean with mild soap and cool water or as told by your doctor. °· Check your rash every day for signs of infection. These include redness, swelling, and pain that lasts or gets worse. °· Do not pick your blisters. °· Do not scratch your rash. °General Instructions  °· Rest as told by your doctor. °· Keep all follow-up visits as told by your doctor. This is important. °· Until your blisters scab over, your infection can cause chickenpox in people who have never had it or been vaccinated against it. To prevent this from happening, avoid touching other people or being around other people, especially: °¨ Babies. °¨ Pregnant women. °¨ Children who have eczema. °¨ Elderly people who have transplants. °¨ People who have chronic illnesses, such as leukemia or AIDS. °GET HELP IF: °· Your pain does not get better with  medicine. °· Your pain does not get better after the rash heals. °· Your rash looks infected. Signs of infection include: °· Redness. °· Swelling. °· Pain that lasts or gets worse. °GET HELP RIGHT AWAY IF: °· The rash is on your face or nose. °· You have pain in your face, pain around your eye area, or loss of feeling on one side of your face. °· You have ear pain or you have ringing in your ear. °· You have loss of taste. °· Your condition gets worse. °  °This information is not intended to replace advice given to you by your health care provider. Make sure you discuss any questions you have with your health care provider. °  °Document Released: 04/19/2008 Document Revised: 11/22/2014 Document Reviewed: 08/13/2014 °Elsevier Interactive Patient Education ©2016 [employer] ° °Neuropathic Pain °Neuropathic pain is pain caused by damage to the nerves that are responsible for certain sensations in your body (sensory nerves). The pain can be caused by damage to:  °· The sensory nerves that send signals to your spinal cord and brain (peripheral nervous system). °· The sensory nerves in your brain or spinal cord (central nervous system). °Neuropathic pain can make you more sensitive to pain. What would be a minor sensation for most people may feel very painful if you have neuropathic pain. This is usually a long-term condition that can be difficult to treat. The type of pain can differ from person to person. It may start   suddenly (acute), or it may develop slowly and last for a long time (chronic). Neuropathic pain may come and go as damaged nerves heal or may stay at the same level for years. It often causes emotional distress, loss of sleep, and a lower quality of life. °CAUSES  °The most common cause of damage to a sensory nerve is diabetes. Many other diseases and conditions can also cause neuropathic pain. Causes of neuropathic pain can be classified as: °· Toxic. Many drugs and chemicals can cause toxic damage. The  most common cause of toxic neuropathic pain is damage from drug treatment for cancer (chemotherapy). °· Metabolic. This type of pain can happen when a disease causes imbalances that damage nerves. Diabetes is the most common of these diseases. Vitamin B deficiency caused by long-term alcohol abuse is another common cause. °· Traumatic. Any injury that cuts, crushes, or stretches a nerve can cause damage and pain. A common example is feeling pain after losing an arm or leg (phantom limb pain). °· Compression-related. If a sensory nerve gets trapped or compressed for a long period of time, the blood supply to the nerve can be cut off. °· Vascular. Many blood vessel diseases can cause neuropathic pain by decreasing blood supply and oxygen to nerves. °· Autoimmune. This type of pain results from diseases in which the body's defense system mistakenly attacks sensory nerves. Examples of autoimmune diseases that can cause neuropathic pain include lupus and multiple sclerosis. °· Infectious. Many types of viral infections can damage sensory nerves and cause pain. Shingles infection is a common cause of this type of pain. °· Inherited. Neuropathic pain can be a symptom of many diseases that are passed down through families (genetic). °SIGNS AND SYMPTOMS  °The main symptom is pain. Neuropathic pain is often described as: °· Burning. °· Shock-like. °· Stinging. °· Hot or cold. °· Itching. °DIAGNOSIS  °No single test can diagnose neuropathic pain. Your health care provider will do a physical exam and ask you about your pain. You may use a pain scale to describe how bad your pain is. You may also have tests to see if you have a high sensitivity to pain and to help find the cause and location of any sensory nerve damage. These tests may include: °· Imaging studies, such as: °¨ X-rays. °¨ CT scan. °¨ MRI. °· Nerve conduction studies to test how well nerve signals travel through your sensory nerves (electrodiagnostic  testing). °· Stimulating your sensory nerves through electrodes on your skin and measuring the response in your spinal cord and brain (somatosensory evoked potentials). °TREATMENT  °Treatment for neuropathic pain may change over time. You may need to try different treatment options or a combination of treatments. Some options include: °· Over-the-counter pain relievers. °· Prescription medicines. Some medicines used to treat other conditions may also help neuropathic pain. These include medicines to: °¨ Control seizures (anticonvulsants). °¨ Relieve depression (antidepressants). °· Prescription-strength pain relievers (narcotics). These are usually used when other pain relievers do not help. °· Transcutaneous nerve stimulation (TENS). This uses electrical currents to block painful nerve signals. The treatment is painless. °· Topical and local anesthetics. These are medicines that numb the nerves. They can be injected as a nerve block or applied to the skin. °· Alternative treatments, such as: °¨ Acupuncture. °¨ Meditation. °¨ Massage. °¨ Physical therapy. °¨ Pain management programs. °¨ Counseling. °HOME CARE INSTRUCTIONS °· Learn as much as you can about your condition. °· Take medicines only as directed by your health   care provider. °· Work closely with all your health care providers to find what works best for you. °· Have a good support system at home. °· Consider joining a chronic pain support group. °SEEK MEDICAL CARE IF: °· Your pain treatments are not helping. °· You are having side effects from your medicines. °· You are struggling with fatigue, mood changes, depression, or anxiety. °  °This information is not intended to replace advice given to you by your health care provider. Make sure you discuss any questions you have with your health care provider. °  °Document Released: 07/29/2004 Document Revised: 11/22/2014 Document Reviewed: 04/11/2014 °Elsevier Interactive Patient Education ©2016 [employer] ° °

## 2016-10-31 ENCOUNTER — Emergency Department (HOSPITAL_COMMUNITY): Payer: Commercial Managed Care - HMO

## 2016-10-31 ENCOUNTER — Emergency Department (HOSPITAL_COMMUNITY)
Admission: EM | Admit: 2016-10-31 | Discharge: 2016-10-31 | Disposition: A | Discharge status: Home / Self Care | Payer: Commercial Managed Care - HMO | Attending: Emergency Medicine | Admitting: Emergency Medicine

## 2016-10-31 ENCOUNTER — Encounter (HOSPITAL_COMMUNITY): Payer: Self-pay | Admitting: *Deleted

## 2016-10-31 DIAGNOSIS — Y929 Unspecified place or not applicable: Secondary | ICD-10-CM | POA: Insufficient documentation

## 2016-10-31 DIAGNOSIS — Z79899 Other long term (current) drug therapy: Secondary | ICD-10-CM | POA: Insufficient documentation

## 2016-10-31 DIAGNOSIS — S4991XA Unspecified injury of right shoulder and upper arm, initial encounter: Secondary | ICD-10-CM | POA: Diagnosis present

## 2016-10-31 DIAGNOSIS — M19019 Primary osteoarthritis, unspecified shoulder: Secondary | ICD-10-CM

## 2016-10-31 DIAGNOSIS — F17210 Nicotine dependence, cigarettes, uncomplicated: Secondary | ICD-10-CM | POA: Diagnosis not present

## 2016-10-31 DIAGNOSIS — M19011 Primary osteoarthritis, right shoulder: Secondary | ICD-10-CM | POA: Diagnosis not present

## 2016-10-31 DIAGNOSIS — X500XXA Overexertion from strenuous movement or load, initial encounter: Secondary | ICD-10-CM | POA: Diagnosis not present

## 2016-10-31 DIAGNOSIS — M7501 Adhesive capsulitis of right shoulder: Secondary | ICD-10-CM | POA: Insufficient documentation

## 2016-10-31 DIAGNOSIS — S46911A Strain of unspecified muscle, fascia and tendon at shoulder and upper arm level, right arm, initial encounter: Secondary | ICD-10-CM | POA: Insufficient documentation

## 2016-10-31 DIAGNOSIS — I10 Essential (primary) hypertension: Secondary | ICD-10-CM | POA: Diagnosis not present

## 2016-10-31 DIAGNOSIS — M25511 Pain in right shoulder: Secondary | ICD-10-CM

## 2016-10-31 DIAGNOSIS — Y9389 Activity, other specified: Secondary | ICD-10-CM | POA: Diagnosis not present

## 2016-10-31 DIAGNOSIS — Y999 Unspecified external cause status: Secondary | ICD-10-CM | POA: Diagnosis not present

## 2016-10-31 MED ORDER — ACETAMINOPHEN 500 MG PO TABS
1000.0000 mg | ORAL_TABLET | Freq: Once | ORAL | Status: AC
  Administered 2016-10-31: 1000 mg via ORAL
  Filled 2016-10-31: qty 2

## 2016-10-31 MED ORDER — MELOXICAM 15 MG PO TABS: 15.0000 mg | ORAL_TABLET | Freq: Every day | ORAL | 0 refills | Status: DC

## 2016-10-31 NOTE — ED Triage Notes (Signed)
Pt reports Rt shoulder pain increased after throwing trash into a dumpster.last night. Pt also reports previous pain to same shoulder.

## 2016-10-31 NOTE — ED Triage Notes (Signed)
PT refuses ice to arm . Pt reports ice makes shoulder hurt more.

## 2016-10-31 NOTE — ED Triage Notes (Signed)
PT reports going to multiple doctors to have the RT shoulder pain checked out.

## 2016-10-31 NOTE — ED Provider Notes (Signed)
[facility]-EMERGENCY DEPT Provider Note   CSN: [id number] Arrival date & time: 10/31/16  0708     History   Chief Complaint Chief Complaint  Patient presents with  . Shoulder Pain    HPI Kenneth Wilson is a 40 y.o. Male-to-male transgendered patient with a PMHx of chronic R shoulder pain, bipolar 1 disorder, HTN, HLD, schizoaffective disorder, and gender identity disorder, who presents to the ED with complaints of acute on chronic right shoulder pain. Patient states that she's had issues with her right shoulder before, has seen multiple doctors but has never been given a diagnosis, and has never seen an orthopedist. She states that last night she was throwing her trash into a dumpster and had aggravation of her symptoms. She describes the pain as 10/10 constant sharp and aching nonradiating right shoulder pain, worse with movement, and unrelieved with ibuprofen. Associated symptoms include limited ROM due to pain. She denies any swelling, bruising, erythema, warmth, numbness, tingling, focal distal weakness, or any other complaints at this time.   The history is provided by the patient and medical records. No language interpreter was used.  Shoulder Pain   This is a recurrent problem. The current episode started yesterday. The problem occurs constantly. The problem has not changed since onset.The pain is present in the right shoulder. The quality of the pain is described as sharp and aching. The pain is at a severity of 10/10. The pain is severe. Associated symptoms include limited range of motion (due to pain). Pertinent negatives include no numbness and no tingling. The symptoms are aggravated by activity. He has tried OTC pain medications for the symptoms. The treatment provided no relief. There has been no history of extremity trauma.    Past Medical History:  Diagnosis Date  . Bipolar 1 disorder (HCC)   . Hyperlipemia   . Hypertension   . Schizoaffective disorder [facility])     Patient  Active Problem List   Diagnosis Date Noted  . 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 Dyslipidemia 08/01/2013  . Schizophrenia (HCC) 08/01/2013  . Unspecified essential hypertension 08/01/2013    History reviewed. No pertinent surgical history.     Home Medications    Prior to Admission medications   Medication Sig Start Date End Date Taking? Authorizing Provider  albuterol (PROVENTIL HFA;VENTOLIN HFA) 108 (90 BASE) MCG/ACT inhaler Inhale 1-2 puffs into the lungs every 6 (six) hours as needed for wheezing or shortness of breath. 02/21/14   Mathis Fare Presson, PA  amLODipine (NORVASC) 10 MG tablet Take 10 mg by mouth daily.    Historical Provider, MD  benztropine (COGENTIN) 2 MG tablet Take 2 mg by mouth daily.    Historical Provider, MD  carbamazepine (TEGRETOL) 100 MG chewable tablet Chew 200 mg by mouth at bedtime.    Historical Provider, MD  diclofenac (CATAFLAM) 50 MG tablet Take 1 tablet (50 mg total) by mouth 3 (three) times daily. One tablet TID with food prn pain. 06/06/15   Hayden Rasmussen, NP  furosemide (LASIX) 20 MG tablet Take 20 mg by mouth daily.    Historical Provider, MD  gabapentin (NEURONTIN) 300 MG capsule Take 1 capsule (300 mg total) by mouth 3 (three) times daily. 02/26/16   Tharon Aquas, PA  ibuprofen (ADVIL,MOTRIN) 800 MG tablet Take 1 tablet (800 mg total) by mouth 3 (three) times daily. 06/09/14   Jennifer Piepenbrink, PA-C  lisinopril (PRINIVIL,ZESTRIL) 10 MG tablet Take 10 mg by mouth 2 (two) times daily. 05/14/14  Historical Provider, MD  OVER THE COUNTER MEDICATION estrogen    Historical Provider, MD  oxyCODONE-acetaminophen (PERCOCET/ROXICET) 5-325 MG per tablet Take 1-2 tablets by mouth every 6 (six) hours as needed for severe pain. May take 2 tablets PO q 6 hours for severe pain - Do not take with Tylenol as this tablet already contains tylenol 06/09/14   Jennifer Piepenbrink, PA-C  PERCOCET 5-325 MG tablet Take 1 tablet by mouth every 6 (six) hours as needed  for severe pain. 02/24/16   Charlestine Nighthristopher Lawyer, PA-C  pravastatin (PRAVACHOL) 10 MG tablet Take 10 mg by mouth daily.    Historical Provider, MD  SERTRALINE HCL PO Take 2 tablets by mouth daily.    Historical Provider, MD  Tetrahydrozoline HCl (VISINE OP) Place 2 drops into both eyes daily as needed (for dry eyes).    Historical Provider, MD  tiZANidine (ZANAFLEX) 4 MG tablet Take 1 tablet (4 mg total) by mouth every 6 (six) hours as needed for muscle spasms. 10/06/15   Nicole Pisciotta, PA-C  valACYclovir (VALTREX) 1000 MG tablet Take 1 tablet (1,000 mg total) by mouth 2 (two) times daily. 02/24/16   Charlestine Nighthristopher Lawyer, PA-C  ziprasidone (GEODON) 80 MG capsule Take 80 mg by mouth 2 (two) times daily with a meal.    Historical Provider, MD    Family History History reviewed. No pertinent family history.  Social History Social History  Substance Use Topics  . Smoking status: Current Every Day Smoker    Packs/day: 0.02    Types: Cigarettes  . Smokeless tobacco: Never Used  . Alcohol use No     Comment: socially     Allergies   Patient has no known allergies.   Review of Systems Review of Systems  Musculoskeletal: Positive for arthralgias (R shoulder). Negative for joint swelling.  Skin: Negative for color change.  Allergic/Immunologic: Negative for immunocompromised state.  Neurological: Negative for tingling, weakness and numbness.  Psychiatric/Behavioral: Negative for confusion.   10 Systems reviewed and are negative for acute change except as noted in the HPI.   Physical Exam Updated Vital Signs BP 146/100 (BP Location: Left Arm)   Pulse 70   Temp 98.2 F (36.8 C) (Oral)   Resp 18   Ht 6\' 1"  (1.854 m)   Wt 116.1 kg   SpO2 99%   BMI 33.78 kg/m   Physical Exam  Constitutional: He is oriented to person, place, and time. Vital signs are normal. He appears well-developed and well-nourished.  Non-toxic appearance. No distress.  Afebrile, nontoxic, NAD  HENT:  Head:  Normocephalic and atraumatic.  Mouth/Throat: Mucous membranes are normal.  Eyes: Conjunctivae and EOM are normal. Right eye exhibits no discharge. Left eye exhibits no discharge.  Neck: Normal range of motion. Neck supple.  Cardiovascular: Normal rate and intact distal pulses.   Pulmonary/Chest: Effort normal. No respiratory distress.  Abdominal: Normal appearance. He exhibits no distension.  Musculoskeletal:       Right shoulder: He exhibits decreased range of motion (due to pain) and tenderness. He exhibits no swelling, no effusion, no crepitus, no deformity, no laceration, no spasm, normal pulse and normal strength.  R shoulder with limited active and passive ROM due to pain, no focal bony TTP, but with moderate diffuse joint line and muscular TTP most focally at the bicipital tendon region, no spasms, no swelling/effusion, no bruising or erythema, no warmth, no crepitus/deformity, unable to perform apley scratch or empty can test due to pain. +pain with resisted int/ext rotation. Distal  Strength and sensation grossly intact , distal pulses intact. Compartments soft.    Neurological: He is alert and oriented to person, place, and time. He has normal strength. No sensory deficit.  Skin: Skin is warm, dry and intact. No rash noted.  Psychiatric: He has a normal mood and affect.  Nursing note and vitals reviewed.    ED Treatments / Results  Labs (all labs ordered are listed, but only abnormal results are displayed) Labs Reviewed - No data to display  EKG  EKG Interpretation None       Radiology Dg Shoulder Right  Result Date: 10/31/2016 CLINICAL DATA:  Right shoulder pain over the last 2 years. EXAM: RIGHT SHOULDER - 2+ VIEW COMPARISON:  07/12/2007 FINDINGS: Glenohumeral joint appears normal. Normal humeral acromial distance. 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 IMPRESSION: Mild osteoarthritis of the AC joint.  Otherwise normal. Electronically Signed   By: Paulina FusiMark  Shogry M.D.    On: 10/31/2016 08:45    Procedures Procedures (including critical care time)  Medications Ordered in ED Medications  acetaminophen (TYLENOL) tablet 1,000 mg (1,000 mg Oral Given 10/31/16 0740)     Initial Impression / Assessment and Plan / ED Course  I have reviewed the triage vital signs and the nursing notes.  Pertinent labs & imaging results that were available during my care of the patient were reviewed by me and considered in my medical decision making (see chart for details).  Clinical Course     40 y.o. male-to-male transgendered patient, who comes in for R shoulder pain. States she's had issues with it before, but hurt it again while tossing trash into a dumpster last night. On exam, limited ROM due to pain both passively and actively, with moderate TTP to the bicipital tendon area and diffusely throughout the shoulder joint line; NVI with soft compartments. Xray ordered in triage, will await results. Tylenol given. Will reassess shortly.  9:14 AM Xray with mild AC joint arthritis but otherwise neg. This could potentially be the cause of chronic shoulder pain; adhesive capsulitis vs rotator cuff pathology could also be at play. Discussed sling x2 days then ROM exercises thereafter. RICE, start on mobic daily, and use additional tylenol for pain, and f/up with ortho in 1-2wks for ongoing management of her recurrent R shoulder pain. I explained the diagnosis and have given explicit precautions to return to the ER including for any other new or worsening symptoms. The patient understands and accepts the medical plan as it's been dictated and I have answered their questions. Discharge instructions concerning home care and prescriptions have been given. The patient is STABLE and is discharged to home in good condition.   Final Clinical Impressions(s) / ED Diagnoses   Final diagnoses:  Acute pain of right shoulder  AC (acromioclavicular) joint arthritis  Strain of right shoulder,  initial encounter  Adhesive capsulitis of right shoulder    New Prescriptions New Prescriptions   MELOXICAM (MOBIC) 15 MG TABLET    Take 1 tablet (15 mg total) by mouth daily. TAKE WITH MEALS     Tasnia Spegal Camprubi-Soms, PA-C 10/31/16 [id number]    Margarita Grizzleanielle Ray, MD 11/01/16 [phone]

## 2016-10-31 NOTE — ED Triage Notes (Signed)
When placing ice on RT shoulder , Pt un able to state where the pain in the RT shoulder was located.

## 2016-10-31 NOTE — ED Notes (Signed)
Declined W/C at D/C and was escorted to lobby by RN. 

## 2016-10-31 NOTE — ED Triage Notes (Signed)
PT reports taking advil 800 mg at 0300 today

## 2016-10-31 NOTE — Discharge Instructions (Signed)
Wear shoulder sling for no more than 2 days, then begin performing gentle range of motion exercises. Ice your shoulder throughout the day, using an ice pack for 20 minutes at a time every hour. Take mobic daily to help with your shoulder pain. Use tylenol as needed for additional relief. Call orthopedic follow up today or tomorrow to schedule followup appointment for recheck and ongoing management of your shoulder pain in 1-2 weeks. Return to the ER for changes or worsening symptoms.

## 2017-01-18 ENCOUNTER — Encounter (HOSPITAL_COMMUNITY): Payer: Self-pay | Admitting: Emergency Medicine

## 2017-01-18 ENCOUNTER — Ambulatory Visit (HOSPITAL_COMMUNITY)
Admission: EM | Admit: 2017-01-18 | Discharge: 2017-01-18 | Disposition: A | Discharge status: Home / Self Care | Payer: Commercial Managed Care - HMO | Attending: Family Medicine | Admitting: Family Medicine

## 2017-01-18 DIAGNOSIS — K529 Noninfective gastroenteritis and colitis, unspecified: Secondary | ICD-10-CM | POA: Diagnosis not present

## 2017-01-18 NOTE — Discharge Instructions (Signed)
You appear to be recovering well from a viral illness. Should your symptoms persist or return with worsening, follow up with primary care, or return to clinic as needed.

## 2017-01-18 NOTE — ED Triage Notes (Signed)
[facility] a [facility] needing a note to return to work. The patient stated that he had abdominal pain and diarrhea earlier and missed work.

## 2017-01-18 NOTE — ED Provider Notes (Signed)
CSN: [id number]     Arrival date & time 01/18/17  1659 History   First MD Initiated Contact with Patient 01/18/17 1725     Chief Complaint  Patient presents with  . Diarrhea   (Consider location/radiation/quality/duration/timing/severity/associated sxs/prior Treatment) 41 year old male presents to clinic with a chief complaint of some viral gastritis. He states he had symptoms over the weekend of nausea, vomiting, and diarrhea. He states he has been recovering well, and that today he is been symptom-free, and been able to eat. He states he was sent home from work today, stating he cannot return without a doctor's note. He denies any chest pain, nausea, vomiting, diarrhea, weakness, dizziness, shortness of breath, swelling in his hands, feet, ankles, blurred vision, congestion, dysuria, or any other complaint.   The history is provided by the patient.  Diarrhea    Past Medical History:  Diagnosis Date  . Bipolar 1 disorder (HCC)   . Hyperlipemia   . Hypertension   . Schizoaffective disorder (HCC)    History reviewed. No pertinent surgical history. History reviewed. No pertinent family history. Social History  Substance Use Topics  . Smoking status: Current Every Day Smoker    Packs/day: 0.02    Types: Cigarettes  . Smokeless tobacco: Never Used  . Alcohol use No     Comment: socially    Review of Systems  Reason unable to perform ROS: As covered in history of present illness.  Gastrointestinal: Positive for diarrhea.  All other systems reviewed and are negative.   Allergies  Patient has no known allergies.  Home Medications   Prior to Admission medications   Medication Sig Start Date End Date Taking? Authorizing Provider  albuterol (PROVENTIL HFA;VENTOLIN HFA) 108 (90 BASE) MCG/ACT inhaler Inhale 1-2 puffs into the lungs every 6 (six) hours as needed for wheezing or shortness of breath. 02/21/14   Mathis FareJennifer Lee H Presson, PA  amLODipine (NORVASC) 10 MG tablet Take 10 mg by  mouth daily.    Historical Provider, MD  benztropine (COGENTIN) 2 MG tablet Take 2 mg by mouth daily.    Historical Provider, MD  carbamazepine (TEGRETOL) 100 MG chewable tablet Chew 200 mg by mouth at bedtime.    Historical Provider, MD  diclofenac (CATAFLAM) 50 MG tablet Take 1 tablet (50 mg total) by mouth 3 (three) times daily. One tablet TID with food prn pain. 06/06/15   Hayden Rasmussenavid Mabe, NP  furosemide (LASIX) 20 MG tablet Take 20 mg by mouth daily.    Historical Provider, MD  gabapentin (NEURONTIN) 300 MG capsule Take 1 capsule (300 mg total) by mouth 3 (three) times daily. 02/26/16   Tharon AquasFrank C Patrick, PA  ibuprofen (ADVIL,MOTRIN) 800 MG tablet Take 1 tablet (800 mg total) by mouth 3 (three) times daily. 06/09/14   Jennifer Piepenbrink, PA-C  lisinopril (PRINIVIL,ZESTRIL) 10 MG tablet Take 10 mg by mouth 2 (two) times daily. 05/14/14   Historical Provider, MD  meloxicam (MOBIC) 15 MG tablet Take 1 tablet (15 mg total) by mouth daily. TAKE WITH MEALS 10/31/16   Mercedes Street, PA-C  OVER THE COUNTER MEDICATION estrogen    Historical Provider, MD  oxyCODONE-acetaminophen (PERCOCET/ROXICET) 5-325 MG per tablet Take 1-2 tablets by mouth every 6 (six) hours as needed for severe pain. May take 2 tablets PO q 6 hours for severe pain - Do not take with Tylenol as this tablet already contains tylenol 06/09/14   Jennifer Piepenbrink, PA-C  PERCOCET 5-325 MG tablet Take 1 tablet by mouth every 6 (six)  hours as needed for severe pain. 02/24/16   Charlestine Night, PA-C  pravastatin (PRAVACHOL) 10 MG tablet Take 10 mg by mouth daily.    Historical Provider, MD  SERTRALINE HCL PO Take 2 tablets by mouth daily.    Historical Provider, MD  Tetrahydrozoline HCl (VISINE OP) Place 2 drops into both eyes daily as needed (for dry eyes).    Historical Provider, MD  tiZANidine (ZANAFLEX) 4 MG tablet Take 1 tablet (4 mg total) by mouth every 6 (six) hours as needed for muscle spasms. 10/06/15   Nicole Pisciotta, PA-C   valACYclovir (VALTREX) 1000 MG tablet Take 1 tablet (1,000 mg total) by mouth 2 (two) times daily. 02/24/16   Charlestine Night, PA-C  ziprasidone (GEODON) 80 MG capsule Take 80 mg by mouth 2 (two) times daily with a meal.    Historical Provider, MD   Meds Ordered and Administered this Visit  Medications - No data to display  BP 118/73 (BP Location: Left Arm)   Pulse 77   Temp 98.5 F (36.9 C) (Oral)   Resp 16   SpO2 100%  No data found.   Physical Exam  Constitutional: He is oriented to person, place, and time. He appears well-developed and well-nourished. No distress.  HENT:  Head: Normocephalic and atraumatic.  Right Ear: Tympanic membrane and external ear normal.  Left Ear: Tympanic membrane and external ear normal.  Nose: Nose normal. Right sinus exhibits no maxillary sinus tenderness and no frontal sinus tenderness. Left sinus exhibits no maxillary sinus tenderness and no frontal sinus tenderness.  Mouth/Throat: Uvula is midline and oropharynx is clear and moist. No oropharyngeal exudate.  Eyes: Pupils are equal, round, and reactive to light.  Neck: Normal range of motion. Neck supple. No JVD present.  Cardiovascular: Normal rate and regular rhythm.   Pulmonary/Chest: Effort normal and breath sounds normal. No respiratory distress. He has no wheezes.  Abdominal: Soft. Bowel sounds are normal. There is no hepatosplenomegaly. There is no tenderness. There is no rigidity, no rebound, no guarding and no CVA tenderness.  Lymphadenopathy:    He has no cervical adenopathy.  Neurological: He is alert and oriented to person, place, and time.  Skin: Skin is warm and dry. Capillary refill takes less than 2 seconds. No rash noted. He is not diaphoretic. No erythema.  Psychiatric: He has a normal mood and affect.  Nursing note and vitals reviewed.   Urgent Care Course     Procedures (including critical care time)  Labs Review Labs Reviewed - No data to display  Imaging  Review No results found.      MDM   1. Gastroenteritis    Patient appears to be recovering well from gastritis, in no acute distress, given work release to return to his regular job.    Dorena Bodo, NP 01/18/17 [phone]

## 2017-02-09 ENCOUNTER — Emergency Department (HOSPITAL_COMMUNITY)
Admission: EM | Admit: 2017-02-09 | Discharge: 2017-02-09 | Disposition: A | Discharge status: Home / Self Care | Payer: Commercial Managed Care - HMO | Attending: Emergency Medicine | Admitting: Emergency Medicine

## 2017-02-09 ENCOUNTER — Encounter (HOSPITAL_COMMUNITY): Payer: Self-pay | Admitting: Emergency Medicine

## 2017-02-09 DIAGNOSIS — Z5321 Procedure and treatment not carried out due to patient leaving prior to being seen by health care provider: Secondary | ICD-10-CM | POA: Diagnosis not present

## 2017-02-09 DIAGNOSIS — M79671 Pain in right foot: Secondary | ICD-10-CM | POA: Insufficient documentation

## 2017-02-09 NOTE — ED Triage Notes (Signed)
Pt. felt a "pop" at his right foot while trying to get up yesterday morning , reports pain with mild swelling , pain increases with palpation/movement .

## 2017-02-09 NOTE — ED Notes (Signed)
Pt states he does not want to wait any longer, and that he is leaving at this time. Pt informed of risk of leaving prior to being examined and verbalizes understanding.

## 2017-02-11 ENCOUNTER — Ambulatory Visit (HOSPITAL_COMMUNITY)
Admission: EM | Admit: 2017-02-11 | Discharge: 2017-02-11 | Disposition: A | Discharge status: Home / Self Care | Payer: Medicare HMO | Attending: Internal Medicine | Admitting: Internal Medicine

## 2017-02-11 ENCOUNTER — Encounter (HOSPITAL_COMMUNITY): Payer: Self-pay | Admitting: Family Medicine

## 2017-02-11 ENCOUNTER — Ambulatory Visit (INDEPENDENT_AMBULATORY_CARE_PROVIDER_SITE_OTHER): Payer: Medicare HMO

## 2017-02-11 DIAGNOSIS — S93601A Unspecified sprain of right foot, initial encounter: Secondary | ICD-10-CM

## 2017-02-11 MED ORDER — MELOXICAM 15 MG PO TABS: 15.0000 mg | ORAL_TABLET | Freq: Every day | ORAL | 1 refills | Status: DC

## 2017-02-11 NOTE — Discharge Instructions (Signed)
Your x-ray was negative for any fracture or dislocation. You most likely have a strain. We have wrapped your ankle here in the clinic, and provided crutches. I have prescribed a medicine called meloxicam for pain, take one tablet daily. I recommend rest, ice, compression, and elevation. If your pain persist past one week, I recommend he follow up with your primary care provider or return to clinic

## 2017-02-11 NOTE — ED Triage Notes (Signed)
Pt here for right foot pain. sts that a few days ago when she was getting ready for work she heard a pop and since has been throbbing and aching. sts pain worse in the middle part of foot.

## 2017-02-11 NOTE — ED Provider Notes (Signed)
CSN: [id number]     Arrival date & time 02/11/17  1521 History   First MD Initiated Contact with Patient 02/11/17 1659     Chief Complaint  Patient presents with  . Foot Pain   (Consider location/radiation/quality/duration/timing/severity/associated sxs/prior Treatment) 41 year old male presents to clinic with a chief complaint of right foot pain. States he was walking the other day when he felt a "pop" in his foot, and he has had significant pain and difficulty walking since. He also is complaining of swelling as well. Denies falling, or any source of trauma other than the sensation of a "pop", he has not rolling his ankles. Pain is on the lateral side of the foot.   The history is provided by the patient.  Foot Pain  This is a new problem. The current episode started 2 days ago. The problem occurs constantly. The problem has been gradually worsening. Nothing aggravates the symptoms. He has tried nothing for the symptoms. The treatment provided no relief.    Past Medical History:  Diagnosis Date  . Bipolar 1 disorder (HCC)   . Hyperlipemia   . Hypertension   . Schizoaffective disorder (HCC)    History reviewed. No pertinent surgical history. History reviewed. No pertinent family history. Social History  Substance Use Topics  . Smoking status: Current Every Day Smoker    Packs/day: 0.02    Types: Cigarettes  . Smokeless tobacco: Never Used  . Alcohol use No     Comment: socially    Review of Systems  Musculoskeletal: Positive for gait problem. Negative for myalgias, neck pain and neck stiffness.  All other systems reviewed and are negative.   Allergies  Patient has no known allergies.  Home Medications   Prior to Admission medications   Medication Sig Start Date End Date Taking? Authorizing Provider  albuterol (PROVENTIL HFA;VENTOLIN HFA) 108 (90 BASE) MCG/ACT inhaler Inhale 1-2 puffs into the lungs every 6 (six) hours as needed for wheezing or shortness of breath.  02/21/14   Mathis Fare Presson, PA  amLODipine (NORVASC) 10 MG tablet Take 10 mg by mouth daily.    Historical Provider, MD  benztropine (COGENTIN) 2 MG tablet Take 2 mg by mouth daily.    Historical Provider, MD  carbamazepine (TEGRETOL) 100 MG chewable tablet Chew 200 mg by mouth at bedtime.    Historical Provider, MD  furosemide (LASIX) 20 MG tablet Take 20 mg by mouth daily.    Historical Provider, MD  gabapentin (NEURONTIN) 300 MG capsule Take 1 capsule (300 mg total) by mouth 3 (three) times daily. 02/26/16   Tharon Aquas, PA  ibuprofen (ADVIL,MOTRIN) 800 MG tablet Take 1 tablet (800 mg total) by mouth 3 (three) times daily. 06/09/14   Jennifer Piepenbrink, PA-C  lisinopril (PRINIVIL,ZESTRIL) 10 MG tablet Take 10 mg by mouth 2 (two) times daily. 05/14/14   Historical Provider, MD  meloxicam (MOBIC) 15 MG tablet Take 1 tablet (15 mg total) by mouth daily. 02/11/17   Dorena Bodo, NP  OVER THE COUNTER MEDICATION estrogen    Historical Provider, MD  oxyCODONE-acetaminophen (PERCOCET/ROXICET) 5-325 MG per tablet Take 1-2 tablets by mouth every 6 (six) hours as needed for severe pain. May take 2 tablets PO q 6 hours for severe pain - Do not take with Tylenol as this tablet already contains tylenol 06/09/14   Jennifer Piepenbrink, PA-C  PERCOCET 5-325 MG tablet Take 1 tablet by mouth every 6 (six) hours as needed for severe pain. 02/24/16   Charlestine Night,  PA-C  pravastatin (PRAVACHOL) 10 MG tablet Take 10 mg by mouth daily.    Historical Provider, MD  SERTRALINE HCL PO Take 2 tablets by mouth daily.    Historical Provider, MD  Tetrahydrozoline HCl (VISINE OP) Place 2 drops into both eyes daily as needed (for dry eyes).    Historical Provider, MD  tiZANidine (ZANAFLEX) 4 MG tablet Take 1 tablet (4 mg total) by mouth every 6 (six) hours as needed for muscle spasms. 10/06/15   Nicole Pisciotta, PA-C  valACYclovir (VALTREX) 1000 MG tablet Take 1 tablet (1,000 mg total) by mouth 2 (two) times  daily. 02/24/16   Charlestine Night, PA-C  ziprasidone (GEODON) 80 MG capsule Take 80 mg by mouth 2 (two) times daily with a meal.    Historical Provider, MD   Meds Ordered and Administered this Visit  Medications - No data to display  BP (!) 141/84 (BP Location: Right Arm) Comment: notified rn  Pulse 71   Temp 98.9 F (37.2 C) (Oral)   Resp 16   SpO2 100%  No data found.   Physical Exam  Constitutional: He is oriented to person, place, and time. He appears well-developed and well-nourished. No distress.  HENT:  Head: Normocephalic and atraumatic.  Right Ear: External ear normal.  Left Ear: External ear normal.  Cardiovascular: Normal rate and regular rhythm.   Pulmonary/Chest: Effort normal and breath sounds normal.  Musculoskeletal:       Right foot: There is decreased range of motion, tenderness and swelling. There is no bony tenderness and no deformity.       Feet:  Neurological: He is alert and oriented to person, place, and time.  Skin: Skin is warm and dry. Capillary refill takes less than 2 seconds. He is not diaphoretic.  Psychiatric: His behavior is normal.  Nursing note and vitals reviewed.   Urgent Care Course     Procedures (including critical care time)  Labs Review Labs Reviewed - No data to display  Imaging Review Dg Foot Complete Right  Result Date: 02/11/2017 CLINICAL DATA:  Per pt: three days ago a bone in the right foot popped, right foot has been swollen since, has been elevating, still can't bear weight without a lot of pain. Patient pointed to the lateral 5th metatarsal of the right foot. No injury. No prior injury to the right foot. Patient is not a diabetic EXAM: RIGHT FOOT COMPLETE - 3+ VIEW COMPARISON:  07/26/2013 FINDINGS: No fracture or bone lesion. The joints are normally spaced and aligned. Mild subcutaneous soft tissue swelling most evident over the dorsal medial aspect of the midfoot. IMPRESSION: No fracture or dislocation. Electronically  Signed   By: Amie Portland M.D.   On: 02/11/2017 16:51     MDM   1. Sprain of right foot, initial encounter   No evidence of fracture or dislocation on x-ray. Treating for sprain of the foot, patient foot was wrapped, and crutches were given. Prescription for meloxicam was sent to the pharmacy, follow-up with orthopedics if pain persists.      Dorena Bodo, NP 02/11/17 1735

## 2017-10-26 IMAGING — CR DG SHOULDER 2+V*R*
3 series · 4 of 4 positions shown · non-contrast
Comparison: 07/12/2007

CLINICAL DATA: Right shoulder pain over the last 2 years.

EXAM:
RIGHT SHOULDER - 2+ VIEW

[shoulder grashey]
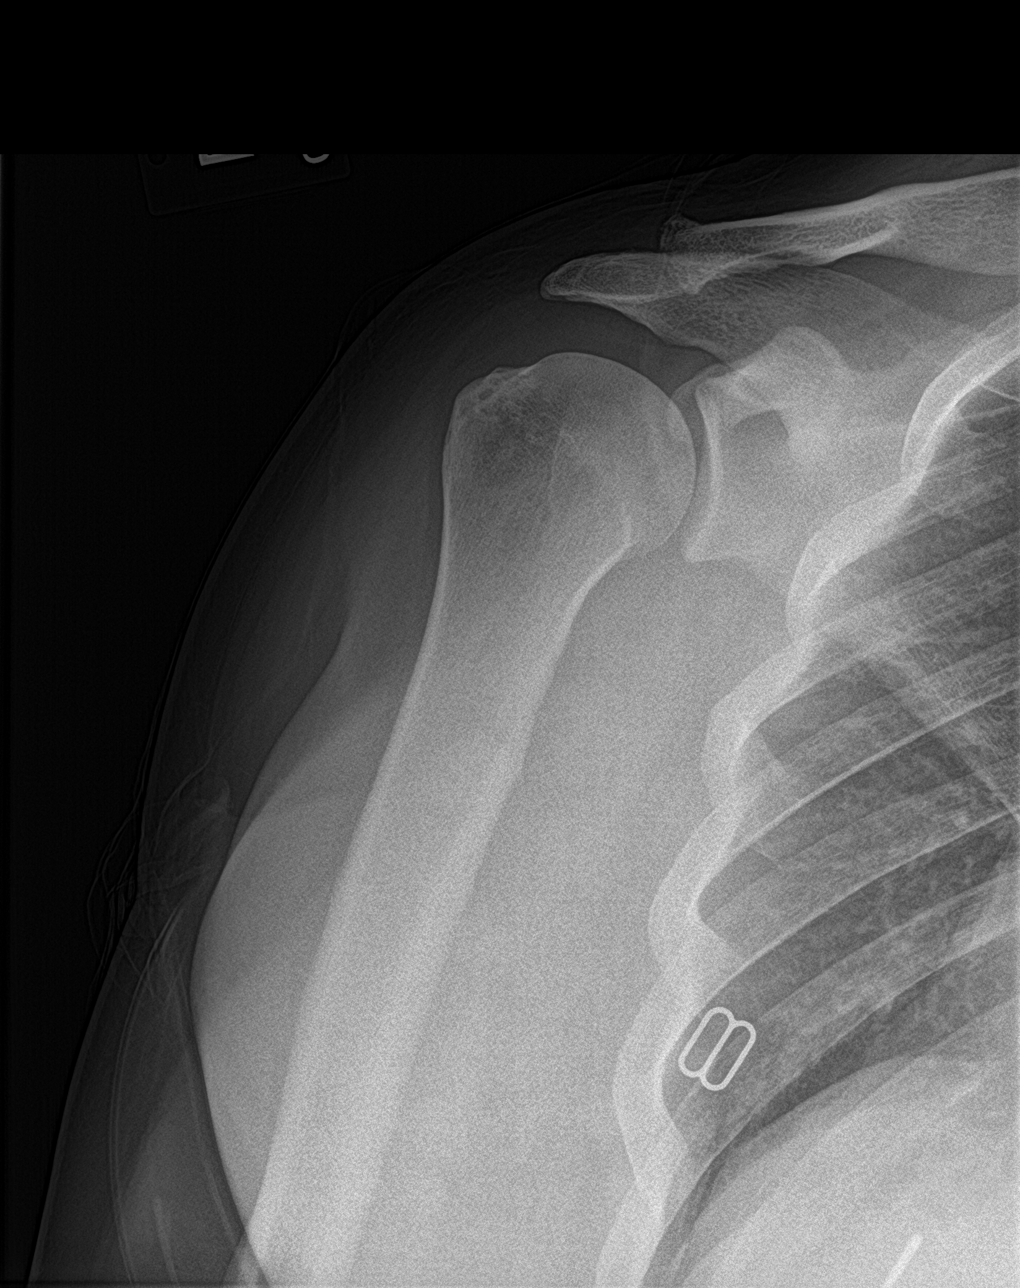

[Series 2: shoulder y view · 0.14mm/px · 2 of 2 slices shown]
[im 1/2]
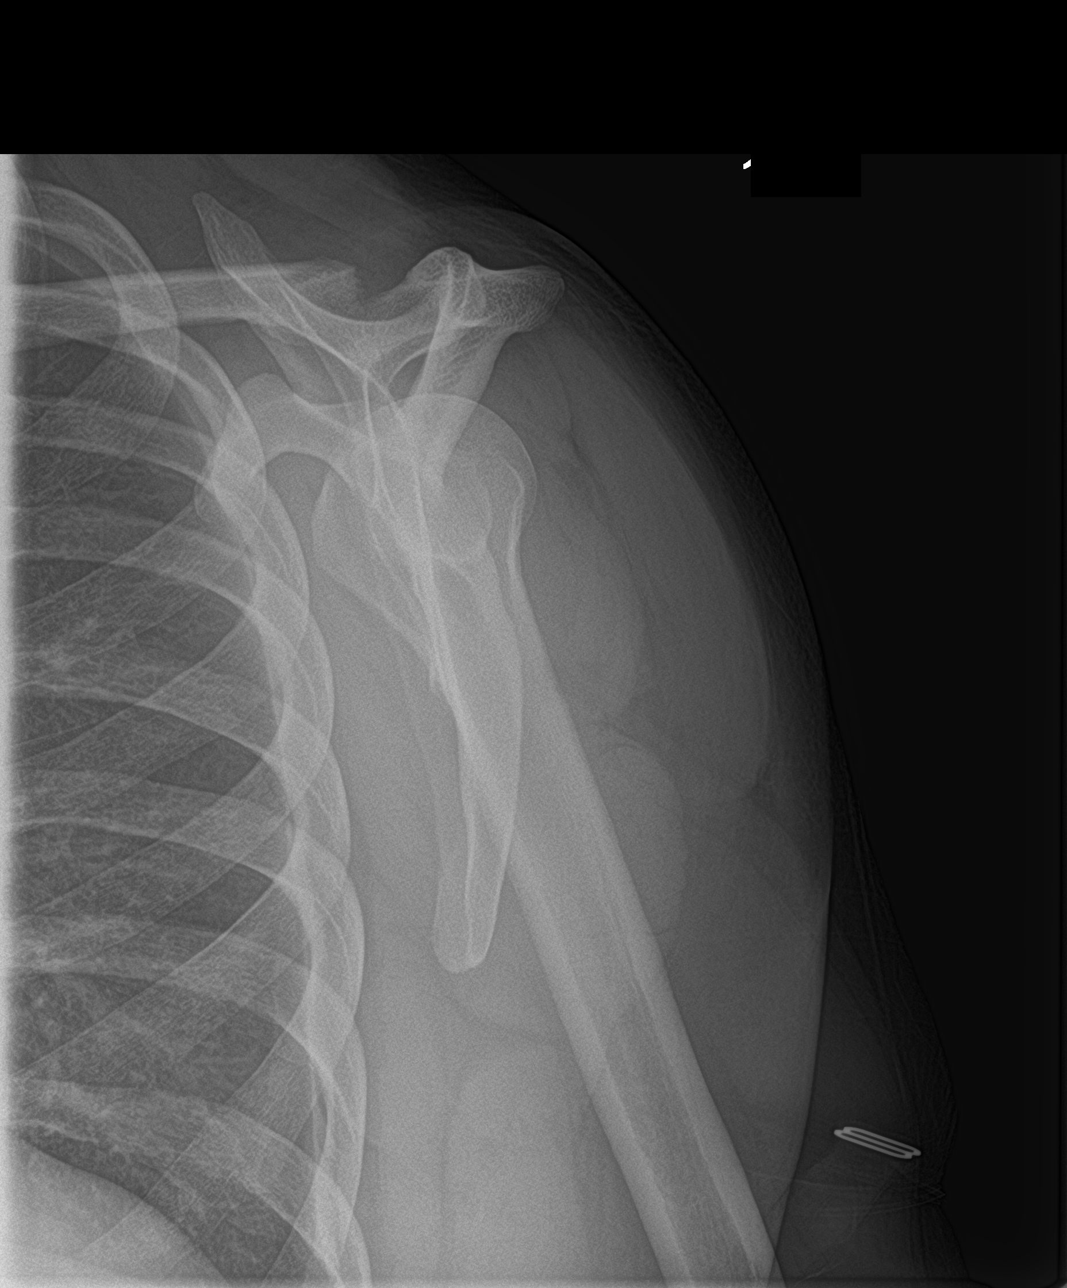
[im 2/2]
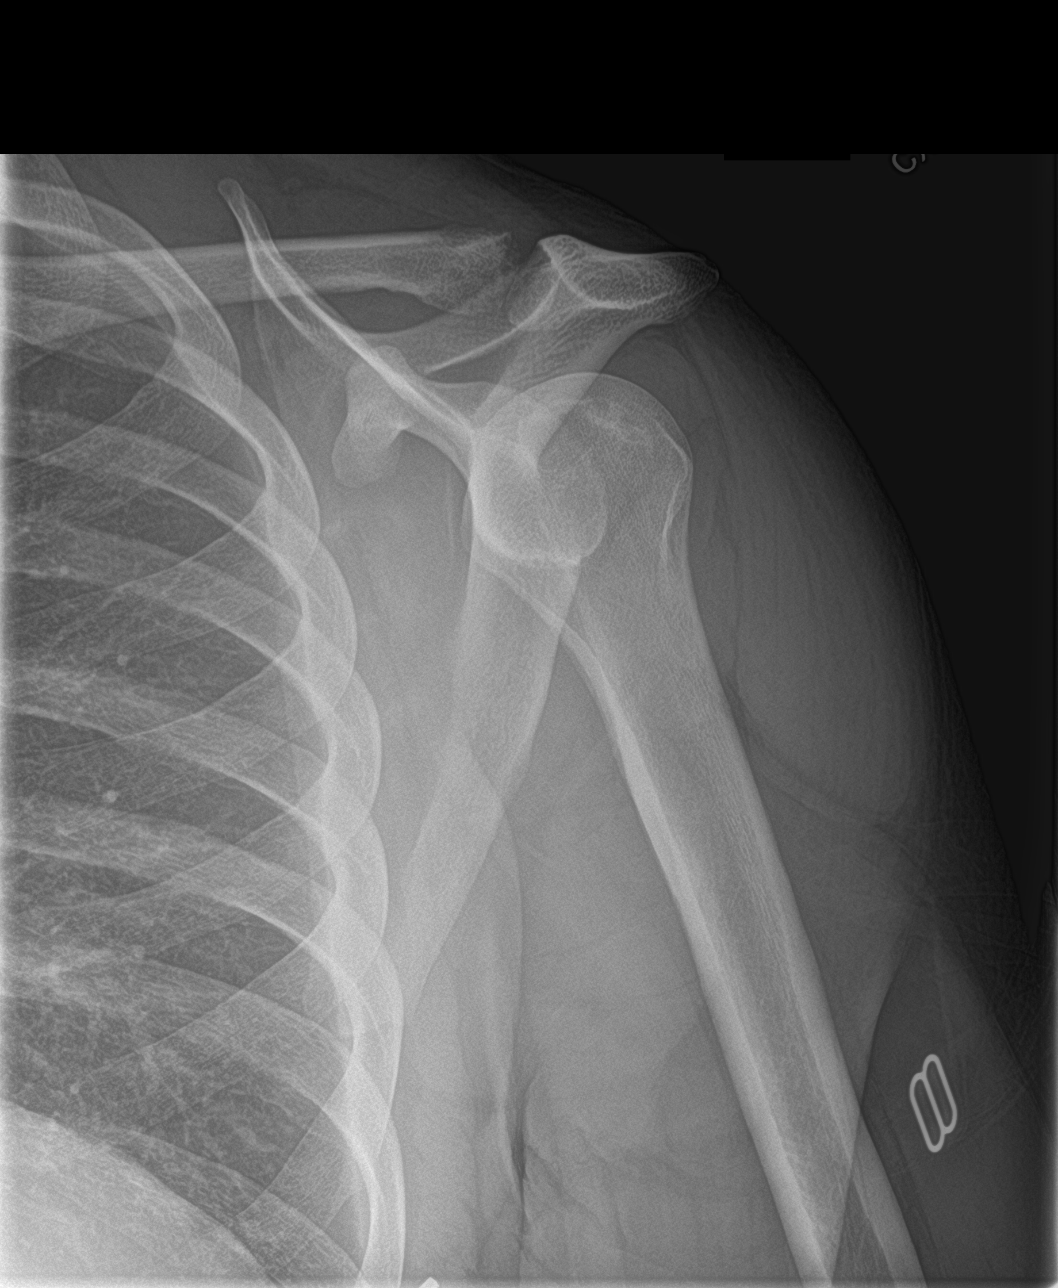

[shoulder axillary]
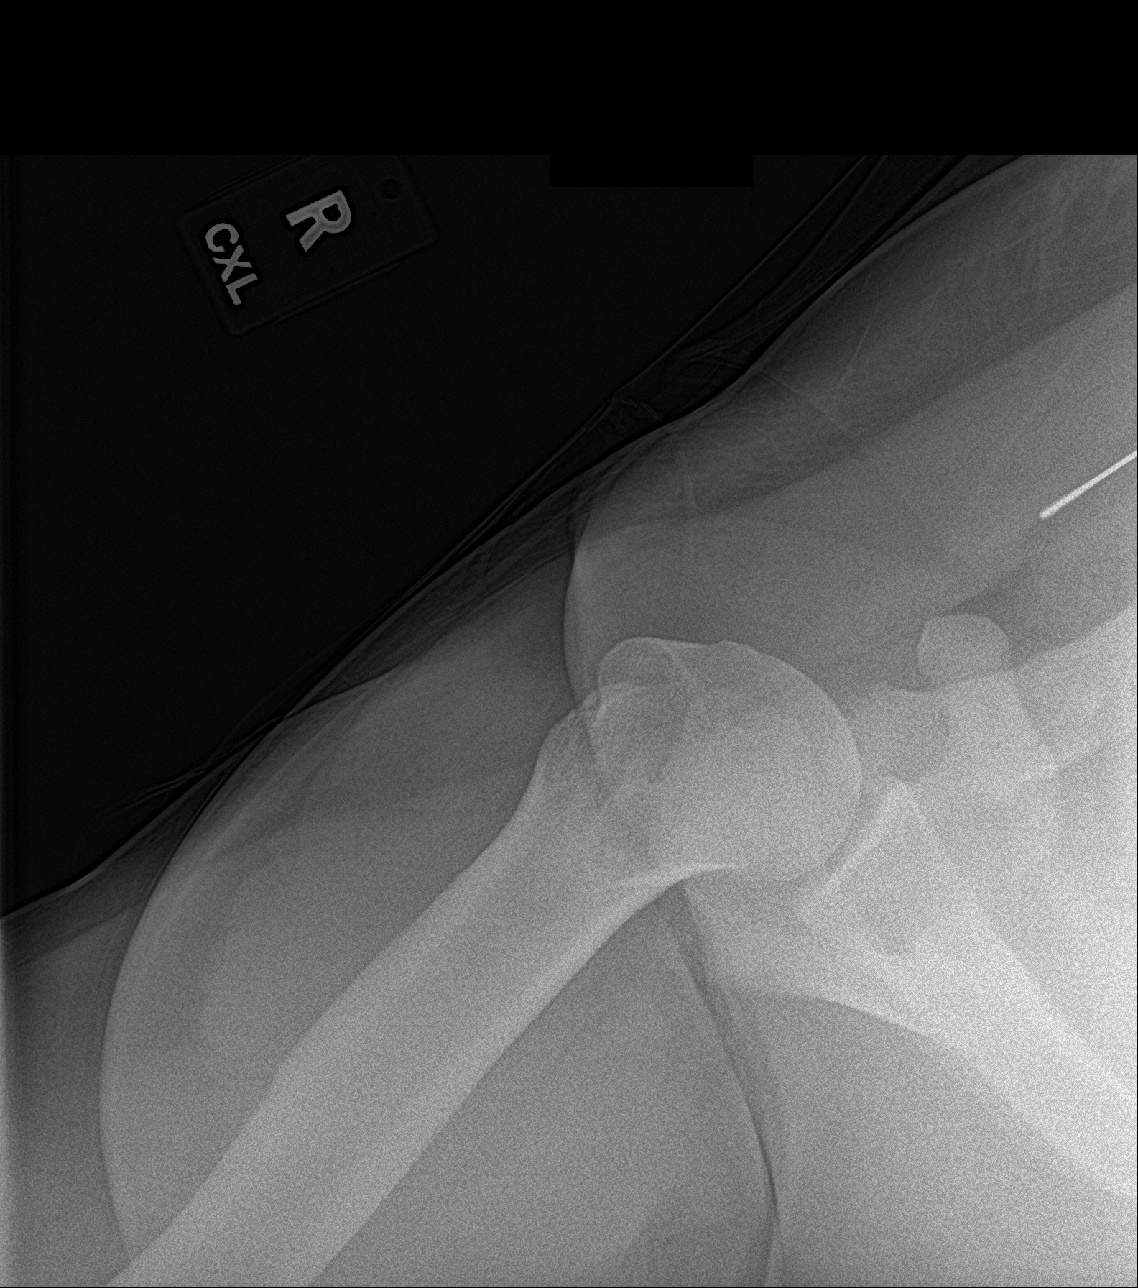

[4 of 4 positions shown; findings below may reference images not displayed]

FINDINGS: Glenohumeral joint appears normal. Normal humeral acromial distance.
There is mild osteoarthritis of the AC joint.
IMPRESSION: Mild osteoarthritis of the AC joint.  Otherwise normal.

## 2018-02-06 IMAGING — DX DG FOOT COMPLETE 3+V*R*
3 series · 3 of 3 positions shown · non-contrast
Comparison: 07/26/2013

CLINICAL DATA: Per pt: three days ago a bone in the right foot
popped, right foot has been swollen since, has been elevating, still
can't bear weight without a lot of pain. Patient pointed to the
lateral 5th metatarsal of the right foot. No injury. No prior injury
to the right foot. Patient is not a diabetic

EXAM:
RIGHT FOOT COMPLETE - 3+ VIEW

[foot ap]
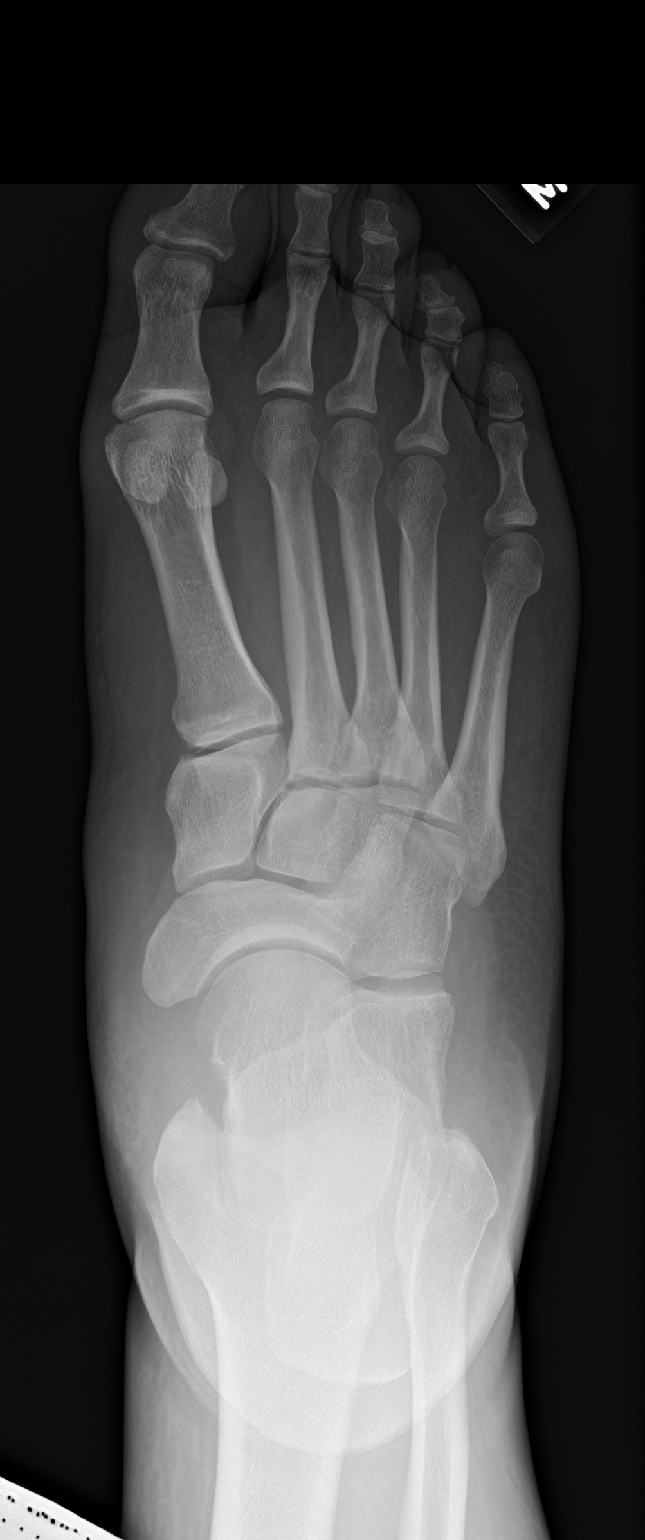

[foot obl]
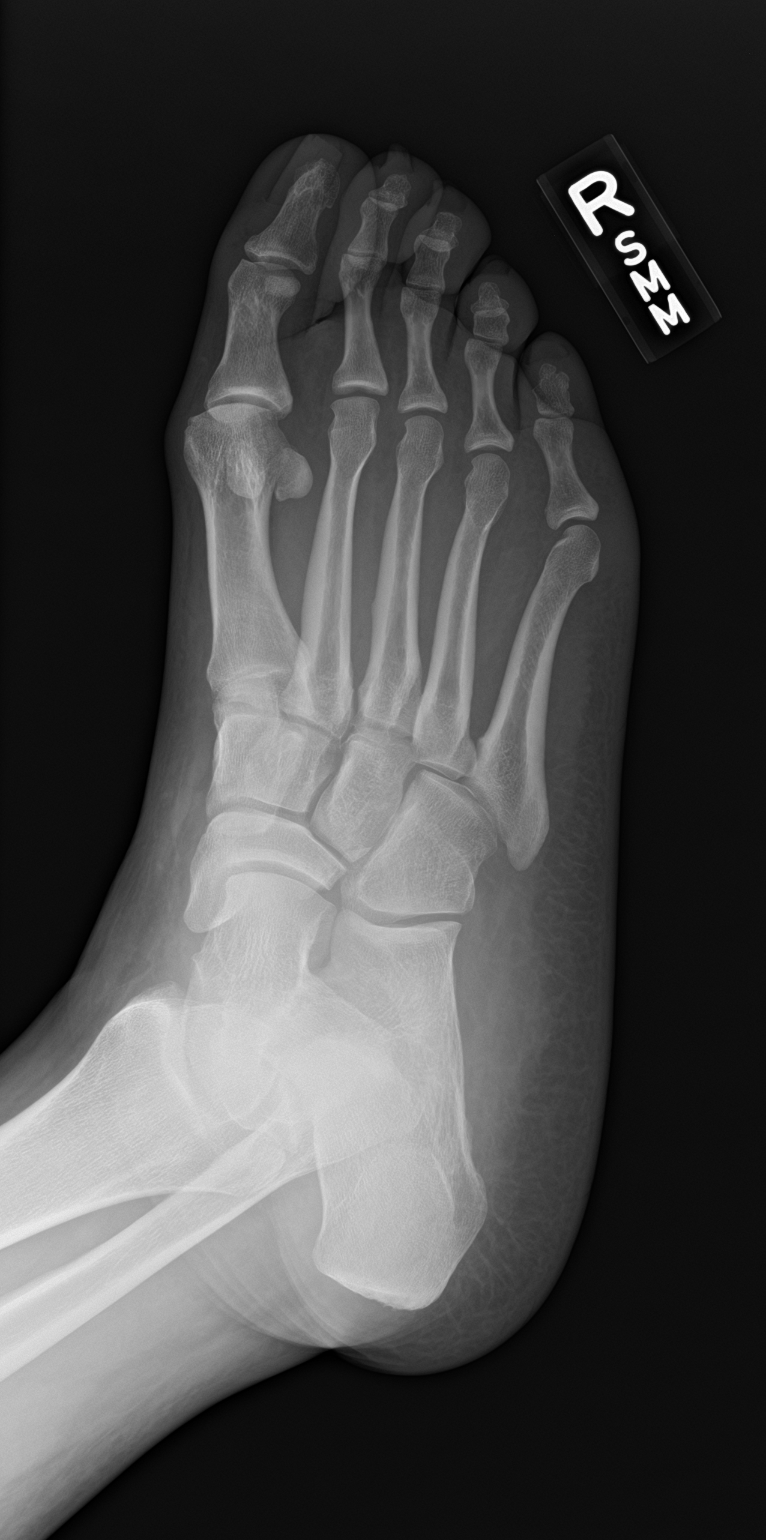

[foot lat]
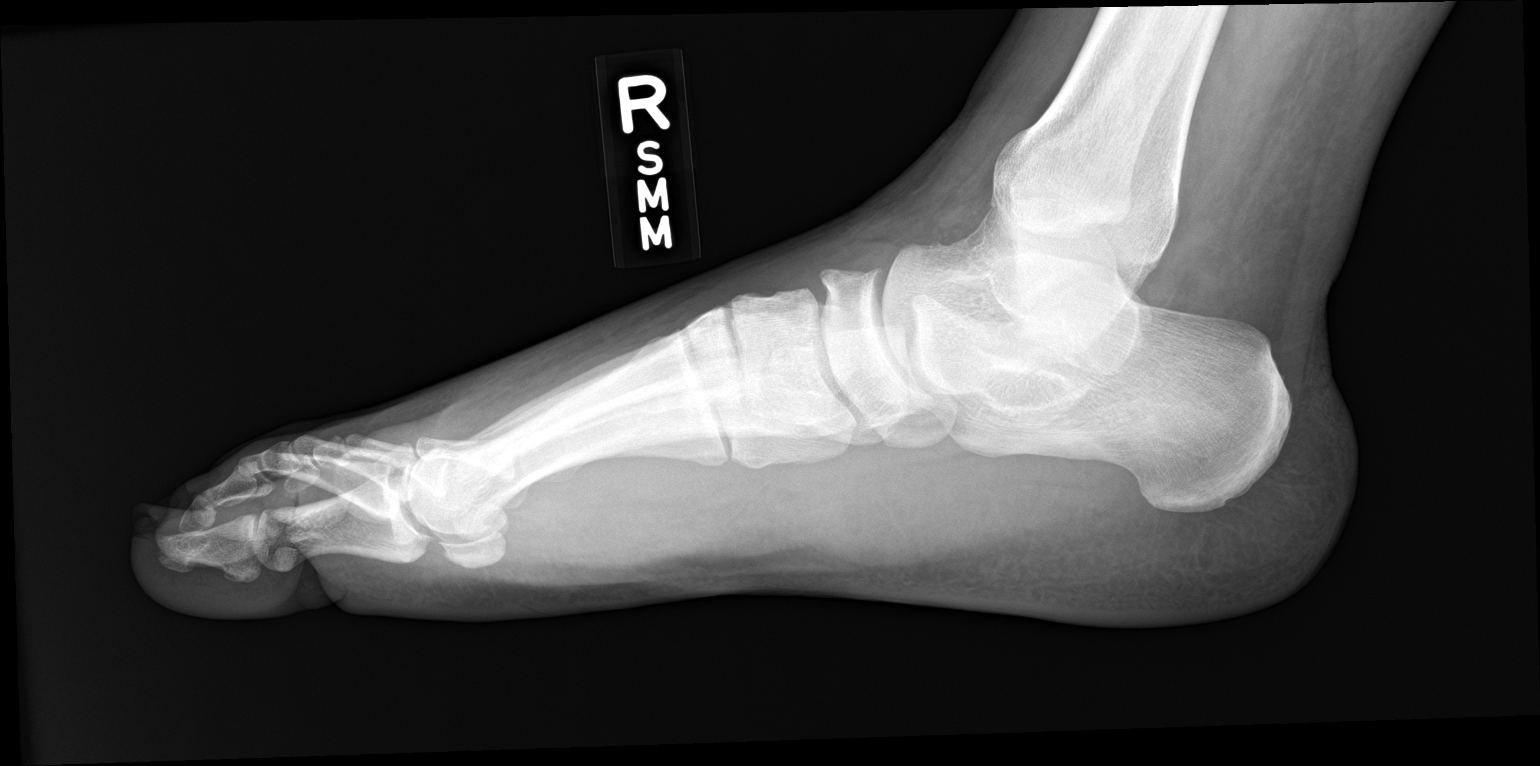

[3 of 3 positions shown; findings below may reference images not displayed]

FINDINGS: No fracture or bone lesion. The joints are normally spaced and
aligned.

Mild subcutaneous soft tissue swelling most evident over the dorsal
medial aspect of the midfoot.
IMPRESSION: No fracture or dislocation.

## 2018-07-13 ENCOUNTER — Encounter (HOSPITAL_COMMUNITY): Payer: Self-pay | Admitting: Emergency Medicine

## 2018-07-13 ENCOUNTER — Ambulatory Visit (HOSPITAL_COMMUNITY)
Admission: EM | Admit: 2018-07-13 | Discharge: 2018-07-13 | Disposition: A | Discharge status: Home / Self Care | Payer: Medicare HMO

## 2018-07-13 DIAGNOSIS — R319 Hematuria, unspecified: Secondary | ICD-10-CM | POA: Diagnosis not present

## 2018-07-13 DIAGNOSIS — R1032 Left lower quadrant pain: Secondary | ICD-10-CM

## 2018-07-13 LAB — POCT URINALYSIS DIP (DEVICE)
GLUCOSE, UA: NEGATIVE mg/dL
Ketones, ur: NEGATIVE mg/dL
Leukocytes, UA: NEGATIVE
NITRITE: NEGATIVE
PROTEIN: 100 mg/dL — AB
Specific Gravity, Urine: 1.025 (ref 1.005–1.030)
UROBILINOGEN UA: 1 mg/dL (ref 0.0–1.0)
pH: 6 (ref 5.0–8.0)

## 2018-07-13 MED ORDER — KETOROLAC TROMETHAMINE 30 MG/ML IJ SOLN
30.0000 mg | Freq: Once | INTRAMUSCULAR | Status: AC
  Administered 2018-07-13: 30 mg via INTRAMUSCULAR

## 2018-07-13 MED ORDER — NAPROXEN 500 MG PO TABS: 500.0000 mg | ORAL_TABLET | Freq: Two times a day (BID) | ORAL | 0 refills | Status: DC

## 2018-07-13 MED ORDER — KETOROLAC TROMETHAMINE 30 MG/ML IJ SOLN
INTRAMUSCULAR | Status: AC
  Filled 2018-07-13: qty 1

## 2018-07-13 NOTE — ED Triage Notes (Signed)
PT reports LLQ abdominal pain for 2 days. Nausea yesterday. PT reports normal BM last night.

## 2018-07-13 NOTE — ED Provider Notes (Signed)
[facility]    CSN: [id number] Arrival date & time: 07/13/18  1646     History   Chief Complaint Chief Complaint  Patient presents with  . Abdominal Pain    HPI Kenneth Wilson is a 42 y.o. male.   Is a 42 year old male with past medical history of bipolar, hyperlipidemia, hypertension, schizoaffective disorder.  He presents with 3 days of constant and worsening left lower quadrant pain.  Reports that the pain did ease off some this a.m. but then came back this afternoon worse than it had ever had been.  He takes 10 mg oxycodone 3 times a day for chronic pain.  He denies any dysuria, hematuria, urinary frequency, urgency, fever, chills, body aches.  He reports some nausea but denies any constipation, diarrhea.  The pain is worse with bending, moving, coughing, sneezing, breathing.  He denies any recent injury, heavy lifting or strenuous activity.  Denies any penile discharge, swelling, pain, testicular swelling or pain.  Sometimes the pain radiates into his testicles.  He does smoke and has occasional alcohol use.  ROS per HPI      Past Medical History:  Diagnosis Date  . Bipolar 1 disorder (HCC)   . Hyperlipemia   . Hypertension   . Schizoaffective disorder [facility])     Patient Active Problem List   Diagnosis Date Noted  . 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 Dyslipidemia 08/01/2013  . Schizophrenia (HCC) 08/01/2013  . Unspecified essential hypertension 08/01/2013    History reviewed. No pertinent surgical history.     Home Medications    Prior to Admission medications   Medication Sig Start Date End Date Taking? Authorizing Provider  amLODipine (NORVASC) 10 MG tablet Take 10 mg by mouth daily.   Yes [provider]  benztropine (COGENTIN) 2 MG tablet Take 2 mg by mouth daily.   Yes [provider]  carbamazepine (TEGRETOL) 100 MG chewable tablet Chew 200 mg by mouth at bedtime.   Yes [provider]  lisinopril  (PRINIVIL,ZESTRIL) 10 MG tablet Take 10 mg by mouth 2 (two) times daily. 05/14/14  Yes [provider]  montelukast (SINGULAIR) 10 MG tablet Take 10 mg by mouth at bedtime.   Yes [provider]  oxyCODONE-acetaminophen (PERCOCET/ROXICET) 5-325 MG per tablet Take 1-2 tablets by mouth every 6 (six) hours as needed for severe pain. May take 2 tablets PO q 6 hours for severe pain - Do not take with Tylenol as this tablet already contains tylenol 06/09/14  Yes Piepenbrink, Victorino Dike, PA-C  pravastatin (PRAVACHOL) 10 MG tablet Take 10 mg by mouth daily.   Yes [provider]  SERTRALINE HCL PO Take 2 tablets by mouth daily.   Yes [provider]  spironolactone (ALDACTONE) 100 MG tablet Take 100 mg by mouth 2 (two) times daily.   Yes [provider]  ziprasidone (GEODON) 80 MG capsule Take 80 mg by mouth 2 (two) times daily with a meal.   Yes [provider]  albuterol (PROVENTIL HFA;VENTOLIN HFA) 108 (90 BASE) MCG/ACT inhaler Inhale 1-2 puffs into the lungs every 6 (six) hours as needed for wheezing or shortness of breath. 02/21/14   Presson, Mathis Fare, PA  estradiol (ESTRACE) 2 MG tablet Take 6 mg by mouth daily.    [provider]  furosemide (LASIX) 20 MG tablet Take 20 mg by mouth daily.    [provider]  gabapentin (NEURONTIN) 300 MG capsule Take 1 capsule (300 mg total) by mouth  3 (three) times daily. 02/26/16   Tharon AquasPatrick, Frank C, PA  ibuprofen (ADVIL,MOTRIN) 800 MG tablet Take 1 tablet (800 mg total) by mouth 3 (three) times daily. 06/09/14   Piepenbrink, Victorino DikeJennifer, PA-C  meloxicam (MOBIC) 15 MG tablet Take 1 tablet (15 mg total) by mouth daily. 02/11/17   Dorena BodoKennard, Lawrence, NP  naproxen (NAPROSYN) 500 MG tablet Take 1 tablet (500 mg total) by mouth 2 (two) times daily. 07/13/18   Janace ArisBast, Eri Platten A, NP  OVER THE COUNTER MEDICATION estrogen    [provider]  PERCOCET 5-325 MG tablet Take 1 tablet by mouth every 6 (six) hours  as needed for severe pain. 02/24/16   Lawyer, Cristal Deerhristopher, PA-C  Tetrahydrozoline HCl (VISINE OP) Place 2 drops into both eyes daily as needed (for dry eyes).    [provider]  tiZANidine (ZANAFLEX) 4 MG tablet Take 1 tablet (4 mg total) by mouth every 6 (six) hours as needed for muscle spasms. 10/06/15   Pisciotta, Joni ReiningNicole, PA-C  valACYclovir (VALTREX) 1000 MG tablet Take 1 tablet (1,000 mg total) by mouth 2 (two) times daily. 02/24/16   Lawyer, Cristal Deerhristopher, PA-C    Family History No family history on file.  Social History Social History   Tobacco Use  . Smoking status: Current Every Day Smoker    Packs/day: 0.02    Types: Cigarettes  . Smokeless tobacco: Never Used  Substance Use Topics  . Alcohol use: No    Comment: socially  . Drug use: No     Allergies   Patient has no known allergies.   Review of Systems Review of Systems   Physical Exam Triage Vital Signs ED Triage Vitals  Enc Vitals Group     BP 07/13/18 1709 139/90     Pulse Rate 07/13/18 1709 93     Resp 07/13/18 1709 16     Temp 07/13/18 1709 99 F (37.2 C)     Temp Source 07/13/18 1709 Oral     SpO2 07/13/18 1709 100 %     Weight 07/13/18 1710 214 lb (97.1 kg)     Height --      Head Circumference --      Peak Flow --      Pain Score 07/13/18 1710 9     Pain Loc --      Pain Edu? --      Excl. in GC? --    No data found.  Updated Vital Signs BP 139/90   Pulse 93   Temp 99 F (37.2 C) (Oral)   Resp 16   Wt 214 lb (97.1 kg)   SpO2 100%   BMI 28.23 kg/m   Visual Acuity Right Eye Distance:   Left Eye Distance:   Bilateral Distance:    Right Eye Near:   Left Eye Near:    Bilateral Near:     Physical Exam  Constitutional: He appears well-developed and well-nourished.  Very pleasant. Non toxic or ill appearing.   HENT:  Head: Normocephalic and atraumatic.  Cardiovascular: Normal rate.  Pulmonary/Chest: Effort normal.  Abdominal: Soft. Normal appearance and bowel sounds are  normal. There is tenderness in the left lower quadrant. There is guarding. There is no rebound and no CVA tenderness. Hernia confirmed negative in the right inguinal area and confirmed negative in the left inguinal area.  Very tender to palpation of the LLQ and groin area.  No masses or rebound.   Neurological: He is alert.  Skin: Skin is warm and dry.  Psychiatric: He has a normal mood and affect.  Nursing note and vitals reviewed.    UC Treatments / Results  Labs (all labs ordered are listed, but only abnormal results are displayed) Labs Reviewed  POCT URINALYSIS DIP (DEVICE) - Abnormal; Notable for the following components:      Result Value   Bilirubin Urine SMALL (*)    Hgb urine dipstick SMALL (*)    Protein, ur 100 (*)    All other components within normal limits    EKG None  Radiology No results found.  Procedures Procedures (including critical care time)  Medications Ordered in UC Medications  ketorolac (TORADOL) 30 MG/ML injection 30 mg (30 mg Intramuscular Given 07/13/18 1753)    Initial Impression / Assessment and Plan / UC Course  I have reviewed the triage vital signs and the nursing notes.  Pertinent labs & imaging results that were available during my care of the patient were reviewed by me and considered in my medical decision making (see chart for details).     Urine sample to r/o infection Most likely groin strain.  Pt is not sexually active so there is no concern for STDs.  No penile or testicle swelling. Some radiation of pain into right testicle at times.   Urine negative for infection.  Some blood in the urine.  Pt has been told this before. Naproxen for pain, continue the oxycodone for chronic pain.  I don't believe this is anything life threatening.  Instructed that if he develops any worsening symptoms to go to the ER.  Patient understanding and agreeable to plan.   Final Clinical Impressions(s) / UC Diagnoses   Final diagnoses:    Left lower quadrant pain     Discharge Instructions     It was nice meeting you!!  You had a small amount of blood in your urine. No trace of infection.  This could be a kidney stone or it could be a groin muscle strain. I don't believe that this is anything life threatening. I would like for you monitor and if the pain gets worse you may want to go to the ER.  Keep taking your oxycodone and you can also take aleve for pain.  I will give you a work note.  Follow up as needed for continued or worsening symptoms     ED Prescriptions    Medication Sig Dispense Auth. Provider   naproxen (NAPROSYN) 500 MG tablet Take 1 tablet (500 mg total) by mouth 2 (two) times daily. 30 tablet Janace Aris, NP     Controlled Substance Prescriptions [place] Controlled Substance Registry consulted? no   Janace Aris, NP 07/14/18 [id number]

## 2018-07-13 NOTE — Discharge Instructions (Addendum)
It was nice meeting you!!  You had a small amount of blood in your urine. No trace of infection.  This could be a kidney stone or it could be a groin muscle strain. I don't believe that this is anything life threatening. I would like for you monitor and if the pain gets worse you may want to go to the ER.  Keep taking your oxycodone and you can also take aleve for pain.  I will give you a work note.  Follow up as needed for continued or worsening symptoms

## 2021-06-16 ENCOUNTER — Other Ambulatory Visit (HOSPITAL_COMMUNITY): Payer: Self-pay | Admitting: Specialist

## 2021-06-16 DIAGNOSIS — M79604 Pain in right leg: Secondary | ICD-10-CM

## 2021-06-17 ENCOUNTER — Other Ambulatory Visit: Payer: Self-pay

## 2021-06-17 ENCOUNTER — Ambulatory Visit (HOSPITAL_COMMUNITY)
Admission: RE | Admit: 2021-06-17 | Discharge: 2021-06-17 | Disposition: A | Discharge status: Home / Self Care | Payer: Medicare HMO | Source: Ambulatory Visit | Attending: Specialist | Admitting: Specialist

## 2021-06-17 DIAGNOSIS — M79605 Pain in left leg: Secondary | ICD-10-CM | POA: Insufficient documentation

## 2021-06-17 DIAGNOSIS — M79604 Pain in right leg: Secondary | ICD-10-CM | POA: Diagnosis not present

## 2022-07-29 ENCOUNTER — Encounter (HOSPITAL_COMMUNITY): Payer: Self-pay

## 2022-07-29 ENCOUNTER — Ambulatory Visit (HOSPITAL_COMMUNITY)
Admission: EM | Admit: 2022-07-29 | Discharge: 2022-07-29 | Disposition: A | Discharge status: Home / Self Care | Payer: Medicare HMO | Attending: Emergency Medicine | Admitting: Emergency Medicine

## 2022-07-29 DIAGNOSIS — R220 Localized swelling, mass and lump, head: Secondary | ICD-10-CM | POA: Diagnosis not present

## 2022-07-29 DIAGNOSIS — T464X5A Adverse effect of angiotensin-converting-enzyme inhibitors, initial encounter: Secondary | ICD-10-CM

## 2022-07-29 DIAGNOSIS — T783XXA Angioneurotic edema, initial encounter: Secondary | ICD-10-CM | POA: Diagnosis not present

## 2022-07-29 MED ORDER — PREDNISONE 20 MG PO TABS: 40.0000 mg | ORAL_TABLET | Freq: Every day | ORAL | 0 refills | Status: AC

## 2022-07-29 NOTE — Discharge Instructions (Addendum)
Please discontinue use of the lisinopril. This is likely the cause of your symptoms.  I recommend following up with your primary care provider regarding your symptoms and blood pressure management. Please schedule an appointment with them as soon as possible.  If you discontinue the lisinopril and lip swelling does not resolve after 2-3 days, take the prednisone 40 mg daily for a total of 5 days.  Please go to the emergency department if symptoms worsen.

## 2022-07-29 NOTE — ED Provider Notes (Signed)
[place]    CSN: [id number] Arrival date & time: 07/29/22  1110      History   Chief Complaint Chief Complaint  Patient presents with   Oral Swelling    HPI Kenneth Wilson is a 46 y.o. adult.  Presents with lower lip swelling Began last night Reports whole lip was swollen this morning, has decreased to only left side of lip over the past 3 hours  No known allergies, denies new medications or foods  History of this two other times in the past with unknown etiology  She takes lisinopril daily Dad has history of ACE inhibitor allergy  Denies shortness of breath or trouble breathing, tongue or face swelling, sensation of throat closing, rash, abdominal pain, etc  Past Medical History:  Diagnosis Date   Bipolar 1 disorder ([place])    Hyperlipemia    Hypertension    Schizoaffective disorder 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 No pertinent surgical history.     Home Medications    Prior to Admission medications   Medication Sig Start Date End Date Taking? Authorizing Provider  predniSONE (DELTASONE) 20 MG tablet Take 2 tablets (40 mg total) by mouth daily for 5 days. 08/01/22 08/06/22 Yes Jordane Hisle, Wells Guiles, PA-C  albuterol (PROVENTIL HFA;VENTOLIN HFA) 108 (90 BASE) MCG/ACT inhaler Inhale 1-2 puffs into the lungs every 6 (six) hours as needed for wheezing or shortness of breath. 02/21/14   Presson, Audelia Hives, PA  amLODipine (NORVASC) 10 MG tablet Take 10 mg by mouth daily.    [provider]  benztropine (COGENTIN) 2 MG tablet Take 2 mg by mouth daily.    [provider]  carbamazepine (TEGRETOL) 100 MG chewable tablet Chew 200 mg by mouth at bedtime.    [provider]  estradiol (ESTRACE) 2 MG tablet Take 6 mg by mouth daily.    [provider]  furosemide (LASIX) 20 MG tablet Take 20 mg by mouth daily.    [provider]  gabapentin (NEURONTIN) 300 MG capsule Take 1 capsule (300 mg total) by mouth 3 (three) times daily. 02/26/16   Konrad Felix, PA  ibuprofen (ADVIL,MOTRIN) 800 MG tablet Take 1 tablet (800 mg total) by mouth 3 (three) times daily. 06/09/14   Piepenbrink, Anderson Malta, PA-C  meloxicam (MOBIC) 15 MG tablet Take 1 tablet (15 mg total) by mouth daily. 02/11/17   Barnet Glasgow, NP  montelukast (SINGULAIR) 10 MG tablet Take 10 mg by mouth at bedtime.    [provider]  naproxen (NAPROSYN) 500 MG tablet Take 1 tablet (500 mg total) by mouth 2 (two) times daily. 07/13/18   Orvan July, NP  OVER THE COUNTER MEDICATION estrogen    [provider]  oxyCODONE-acetaminophen (PERCOCET/ROXICET) 5-325 MG per tablet Take 1-2 tablets by mouth every 6 (six) hours as needed for severe pain. May take 2 tablets PO q 6 hours for severe pain - Do not take with Tylenol as this tablet already contains tylenol 06/09/14   Piepenbrink, Jennifer, PA-C  PERCOCET 5-325 MG tablet Take 1 tablet by mouth every 6 (six) hours as needed for severe pain. 02/24/16   Lawyer, Harrell Gave, PA-C  pravastatin (PRAVACHOL) 10 MG tablet Take 10 mg by mouth daily.    [provider]  SERTRALINE HCL PO  Take 2 tablets by mouth daily.    [provider]  spironolactone (ALDACTONE) 100 MG tablet Take 100 mg by mouth 2 (two) times daily.    [provider]  Tetrahydrozoline HCl (VISINE OP) Place 2 drops into both eyes daily as needed (for dry eyes).    [provider]  tiZANidine (ZANAFLEX) 4 MG tablet Take 1 tablet (4 mg total) by mouth every 6 (six) hours as needed for muscle spasms. 10/06/15   Pisciotta, Joni Reining, PA-C  ziprasidone (GEODON) 80 MG capsule Take 80 mg by mouth 2 (two) times daily with a meal.    [provider]    Family History No family history on file.  Social  History Social History   Tobacco Use   Smoking status: Every Day    Packs/day: 0.02    Types: Cigarettes   Smokeless tobacco: Never  Substance Use Topics   Alcohol use: No    Comment: socially   Drug use: No     Allergies   Patient has no known allergies.   Review of Systems Review of Systems  Per HPI  Physical Exam Triage Vital Signs ED Triage Vitals  Enc Vitals Group     BP 07/29/22 1159 117/80     Pulse Rate 07/29/22 1159 86     Resp 07/29/22 1159 20     Temp 07/29/22 1159 97.8 F (36.6 C)     Temp Source 07/29/22 1159 Oral     SpO2 07/29/22 1159 98 %     Weight --      Height --      Head Circumference --      Peak Flow --      Pain Score 07/29/22 1312 0     Pain Loc --      Pain Edu? --      Excl. in GC? --    No data found.  Updated Vital Signs BP 117/80 (BP Location: Left Arm)   Pulse 86   Temp 97.8 F (36.6 C) (Oral)   Resp 20   SpO2 98%   Physical Exam Constitutional:      General: She is not in acute distress.    Comments: Airway patent, speaks normally  HENT:     Mouth/Throat:     Mouth: Mucous membranes are moist. Angioedema present.     Dentition: No gingival swelling.     Pharynx: Oropharynx is clear. Uvula midline. No pharyngeal swelling or uvula swelling.      Comments: Angioedema of L lower lip Eyes:     Conjunctiva/sclera: Conjunctivae normal.  Cardiovascular:     Rate and Rhythm: Normal rate and regular rhythm.     Pulses: Normal pulses.     Heart sounds: Normal heart sounds.  Pulmonary:     Effort: Pulmonary effort is normal. No respiratory distress.     Breath sounds: Normal breath sounds. No wheezing, rhonchi or rales.  Musculoskeletal:     Cervical back: Normal range of motion.  Lymphadenopathy:     Cervical: No cervical adenopathy.  Skin:    General: Skin is warm and dry.  Neurological:     Mental Status: She is alert and oriented to person, place, and time.     UC Treatments / Results  Labs (all labs  ordered are listed, but only abnormal results are displayed) Labs Reviewed - No data to display  EKG   Radiology No results found.  Procedures Procedures (including critical care time)  Medications Ordered  in UC Medications - No data to display  Initial Impression / Assessment and Plan / UC Course  I have reviewed the triage vital signs and the nursing notes.  Pertinent labs & imaging results that were available during my care of the patient were reviewed by me and considered in my medical decision making (see chart for details).  Likely angioedema from ACE inhibitor use Recommend discontinue lisinopril and follow up with PCP, reports has appointment in 2 weeks. No acute distress at this time, airway patent and only left lower lip swelling, compared to picture from this morning much improved. Discussed monitor symptoms, strict ED precautions. If after 2-3 days lip swelling has not resolved, can try the prednisone 40mg  for 5 days to cover for possible allergic etiology, although at this time likely related to ACE inhibitor and swelling should resolve with discontinuation. Return precautions discussed. Patient agrees to plan  Final Clinical Impressions(s) / UC Diagnoses   Final diagnoses:  Swollen lip  Angioedema of lips, initial encounter  Angiotensin converting enzyme inhibitor-aggravated angioedema, initial encounter     Discharge Instructions      Please discontinue use of the lisinopril. This is likely the cause of your symptoms.  I recommend following up with your primary care provider regarding your symptoms and blood pressure management. Please schedule an appointment with them as soon as possible.  If you discontinue the lisinopril and lip swelling does not resolve after 2-3 days, take the prednisone 40 mg daily for a total of 5 days.  Please go to the emergency department if symptoms worsen.    ED Prescriptions     Medication Sig Dispense Auth. Provider    predniSONE (DELTASONE) 20 MG tablet Take 2 tablets (40 mg total) by mouth daily for 5 days. 10 tablet Antwian Santaana, , PA-C      PDMP not reviewed this encounter.   Abigaelle Verley, Lurena Joiner 07/29/22 1332

## 2022-07-29 NOTE — ED Triage Notes (Signed)
Pt reports lip swelling that started yesterday. Pt reports he has not done anything different to cause lip swelling.

## 2022-12-10 ENCOUNTER — Ambulatory Visit (INDEPENDENT_AMBULATORY_CARE_PROVIDER_SITE_OTHER): Payer: Medicare HMO | Admitting: Podiatry

## 2022-12-10 DIAGNOSIS — Z91199 Patient's noncompliance with other medical treatment and regimen due to unspecified reason: Secondary | ICD-10-CM

## 2022-12-10 NOTE — Progress Notes (Signed)
1. No-show for appointment     

## 2023-02-07 ENCOUNTER — Ambulatory Visit (INDEPENDENT_AMBULATORY_CARE_PROVIDER_SITE_OTHER): Payer: Medicare HMO | Admitting: Mental Health

## 2023-02-07 DIAGNOSIS — F250 Schizoaffective disorder, bipolar type: Secondary | ICD-10-CM

## 2023-02-08 DIAGNOSIS — F250 Schizoaffective disorder, bipolar type: Secondary | ICD-10-CM | POA: Insufficient documentation
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
## 2023-03-11 ENCOUNTER — Encounter (HOSPITAL_COMMUNITY): Payer: Self-pay

## 2023-03-11 ENCOUNTER — Ambulatory Visit (INDEPENDENT_AMBULATORY_CARE_PROVIDER_SITE_OTHER): Payer: Medicare HMO | Admitting: Mental Health

## 2023-03-11 DIAGNOSIS — F4310 Post-traumatic stress disorder, unspecified: Secondary | ICD-10-CM | POA: Insufficient documentation

## 2023-03-11 DIAGNOSIS — F250 Schizoaffective disorder, bipolar type: Secondary | ICD-10-CM | POA: Diagnosis not present

## 2023-03-11 NOTE — Progress Notes (Signed)
THERAPIST PROGRESS NOTE Virtual Visit via Video Note  I connected with Kenneth Wilson on 03/11/23 at 11:00 AM EDT by a video enabled telemedicine application and verified that I am speaking with the correct person using two identifiers.  Location: Patient: home address on file Provider: office   I discussed the limitations of evaluation and management by telemedicine and the availability of in person appointments. The patient expressed understanding and agreed to proceed.  I discussed the assessment and treatment plan with the patient. The patient was provided an opportunity to ask questions and all were answered. The patient agreed with the plan and demonstrated an understanding of the instructions.   The patient was advised to call back or seek an in-person evaluation if the symptoms worsen or if the condition fails to improve as anticipated.  I provided 54 minutes of non-face-to-face time during this encounter.   Dorris Singh, [facility]   Session Time: 11:05 am ( 54 minutes  Participation Level: Active  Behavioral Response: CasualAlertDysphoric  Type of Therapy: Individual Therapy  Treatment Goals addressed:  " I am not happy. Tired of being depressed."Cane "Kenneth Wilson'" will increase management of depression/anxiety AEB development of x 3 emotional regulation coping skills with ability to reframe distorted thoughts within the next 90 days    ProgressTowards Goals: Initial  Interventions: CBT and Supportive  Summary: Kenneth Wilson is a 47 y.o. adult who prefers to go by "Kenneth Wilson" presents with schizoaffective disorder bipolary type and PTSD. Endorses current concern for feelings of low mood and shares feelings of being overwhelmed at this time. Shares for boyfriend to have moved out of the home and although this may be best reports to miss him. Shares " I don't feel happy" Notes additional stressor of family members now wanting use of father's chair they previously denied to  use, unable to have funds that were supposed to go to her after father's passing from deposit on his apartment. Notes presence of natural supports although hx of family not wanting to invite her to events due to not liking her boyfriend. Shares for boyfriend to have continuously cheating on her and for him to be highly negative. Able to engae with therapist and explore working to engage in positive self-talk and engagement in things in which she enjoys as means of coping. Denies SI/HI. No improvement in sxs at this time.    Suicidal/Homicidal: Nowithout intent/plan  Therapist Response: Therapist engaged Kenneth Wilson in tele-therapy session. Reviewed previous intake assessment. Reviewed informed consent and bounds of confidentiality. Provided safe space for Kenneth Wilson to explore thoughts and feelings related to recent stressors. Provided supportive feedback; validated feelings. Provided support and encouragement. Supported Kenneth Wilson in processing history of relationship. Explored feelings of self-esteem and self-confidence and presence of natural supports. Assessed for medication adherence and encouraged to follow up with mediation provider for medication check. Explored hx of coping and factors that support in increasing ability to manage feelings of depression and low mood. Assessed for medication compliance. Reviewed session and provided follow up appointment.   Plan: Return again in  x 4 weeks.  Diagnosis: Schizoaffective disorder, bipolar type (HCC)  PTSD (post-traumatic stress disorder)  Collaboration of Care: Other None  Patient/Guardian was advised Release of Information must be obtained prior to any record release in order to collaborate their care with an outside provider. Patient/Guardian was advised if they have not already done so to contact the registration department to sign all necessary forms in order for [place] to release information  regarding their care.   Consent: Patient/Guardian gives verbal consent  for treatment and assignment of benefits for services provided during this visit. Patient/Guardian expressed understanding and agreed to proceed.   Stephan Minister [place], [facility] 03/11/2023
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
## 2023-04-05 ENCOUNTER — Other Ambulatory Visit: Payer: Self-pay

## 2023-04-05 ENCOUNTER — Encounter: Payer: Self-pay | Admitting: Physical Therapy

## 2023-04-05 ENCOUNTER — Ambulatory Visit: Payer: Medicare HMO | Attending: Orthopedic Surgery | Admitting: Physical Therapy

## 2023-04-05 DIAGNOSIS — M25512 Pain in left shoulder: Secondary | ICD-10-CM | POA: Diagnosis present

## 2023-04-05 DIAGNOSIS — M542 Cervicalgia: Secondary | ICD-10-CM | POA: Insufficient documentation

## 2023-04-05 DIAGNOSIS — M25511 Pain in right shoulder: Secondary | ICD-10-CM | POA: Insufficient documentation

## 2023-04-05 DIAGNOSIS — R293 Abnormal posture: Secondary | ICD-10-CM | POA: Insufficient documentation

## 2023-04-16 ENCOUNTER — Ambulatory Visit: Payer: Medicare HMO | Attending: Orthopedic Surgery

## 2023-04-16 DIAGNOSIS — M25512 Pain in left shoulder: Secondary | ICD-10-CM

## 2023-04-16 DIAGNOSIS — M542 Cervicalgia: Secondary | ICD-10-CM

## 2023-04-16 DIAGNOSIS — M25511 Pain in right shoulder: Secondary | ICD-10-CM | POA: Insufficient documentation

## 2023-04-16 DIAGNOSIS — R293 Abnormal posture: Secondary | ICD-10-CM | POA: Diagnosis present

## 2023-04-16 NOTE — Therapy (Signed)
OUTPATIENT PHYSICAL THERAPY TREATMENT NOTE   [name] MRN: [id number] DOB:April 24, 1976, 47 y.o., adult Today's Date: 04/16/2023  PCP: Lerry Liner REFERRING PROVIDER: [name]  END OF SESSION:   PT End of Session - 04/16/23 1028     Visit Number 2    Date for PT Re-Evaluation 05/31/23    Authorization Type [employer]    Authorization Time Period no auth, 60 VL annual    Progress Note Due on Visit 10    PT Start Time 1028    PT Stop Time 1109    PT Time Calculation (min) 41 min    Activity Tolerance Patient tolerated treatment well    Behavior During Therapy WFL for tasks assessed/performed             Past Medical History:  Diagnosis Date   Bipolar 1 disorder (HCC)    Hyperlipemia    Hypertension    Schizoaffective disorder (HCC)    History reviewed. No pertinent surgical history. Patient Active Problem List   Diagnosis Date Noted   PTSD (post-traumatic stress disorder) 03/11/2023   Schizoaffective disorder, bipolar type (HCC) 02/08/2023   Gender identity disorder 08/01/2013   Dyslipidemia 08/01/2013   Schizophrenia (HCC) 08/01/2013   Unspecified essential hypertension 08/01/2013    REFERRING DIAG: Cervicalgia  THERAPY DIAG:  Cervicalgia  Bilateral shoulder pain, unspecified chronicity  Abnormal posture  Rationale for Evaluation and Treatment Rehabilitation  PERTINENT HISTORY: Schizophrenia, PTSD  PRECAUTIONS: None  SUBJECTIVE:                                                                                                                                                                                      SUBJECTIVE STATEMENT:  Patient reports she is feeling about the same as after evaluation. Still reports pain with holding things and raising arms overhead   PAIN:  Are you having pain? Yes: NPRS scale: 5-6/10 Pain location: Neck and bilateral UE Pain description: numbness/tingling, sharp burning pain Aggravating  factors: holding weight/objects for too long. Reaching overhead Relieving factors: Holding head in sidebend in opposite direction from pain   OBJECTIVE: (objective measures completed at initial evaluation unless otherwise dated)   OBJECTIVE:  PT Treatment 04/16/2023 Manual therapy STM bilateral suboccipitals, UT, SCM, levator scap Grade II-III cervical upglides bilateral C3-7  Therex Rows/pull downs GTB 2x10 Scapular depression with ball 1x15 SNAGs (extension and rotation 1x10 each)    DIAGNOSTIC FINDINGS:  No recent imaging in chart - pt states that an XR was taken which showed nerve irritation and arthritis    PATIENT SURVEYS:  FOTO 56 current, 65 predicted   COGNITION: Overall cognitive status: Within functional  limits for tasks assessed   SENSATION/NEURO: Light touch intact BLE although mildly diminished C5, C6, C8 BIL Negative hoffman/tromner sign BUE, no apparent coordination deficits with object manipulation Gait mechanics WNL, no ataxia   POSTURE: B UT elevation, tendency to hold head towards either side in resting position, increased kyphosis   PALPATION: Notable concordant stiffness B cervicothoracic musculature, no midline pain or gross bony deformities. Most stiffness in L LS/UT   CERVICAL ROM:    Active ROM AROM (deg) eval  Flexion 100% mild pain  Extension 100% relief   Right lateral flexion    Left lateral flexion    Right rotation 61 deg  Left rotation 40 deg *   (Blank rows = not tested) (Key: WFL = within functional limits not formally assessed, * = concordant pain, s = stiffness/stretching sensation, NT = not tested)    UPPER EXTREMITY ROM:   Active ROM Right eval Left eval  Shoulder flexion 150deg (painful from 100 deg onward) 148 deg (painful 108 deg onward)  Shoulder abduction      Shoulder internal rotation      Shoulder external rotation      Elbow flexion      Elbow extension      Wrist flexion      Wrist extension       (Blank  rows = not tested) Comments:    UPPER EXTREMITY MMT:   MMT Right eval Left eval  Shoulder flexion      Shoulder extension      Shoulder abduction      Shoulder extension      Shoulder internal rotation      Shoulder external rotation      Elbow flexion      Elbow extension      Grip strength      (Blank rows = not tested)  (Key: WFL = within functional limits not formally assessed, * = concordant pain, s = stiffness/stretching sensation, NT = not tested)  Comments:    CERVICAL SPECIAL TESTS:  Deferred given time constraints   FUNCTIONAL TESTS:  Limited overhead reach BUE with pain   TODAY'S TREATMENT:                                                                                                                              [facility] Adult PT Treatment:                                                DATE: 04/05/23 Deferred given increased time w/ pt education/subjective   PATIENT EDUCATION:  Education details: Pt education on PT impairments, prognosis, and POC. Informed consent. Rationale for interventions. Extensive time spent w/ education on role of PT and treatment interventions Person educated: Patient Education method: Explanation, Demonstration, Tactile cues, Verbal cues Education comprehension: verbalized  understanding, returned demonstration, verbal cues required, tactile cues required, and needs further education     HOME EXERCISE PROGRAM: Access Code: [email or web address] URL: [email or web address] Date: 04/16/2023 Prepared by: Rinaldo Ratel Sherif Millspaugh  Exercises - Cervical Extension AROM with Strap  - 2 x daily - 7 x weekly - 3 sets - 10 reps - Seated Assisted Cervical Rotation with Towel  - 2 x daily - 7 x weekly - 3 sets - 10 reps - Standing Shoulder Row with Anchored Resistance  - 2 x daily - 7 x weekly - 3 sets - 10 reps - Shoulder extension with resistance - Neutral  - 2 x daily - 7 x weekly - 3 sets - 10 reps   ASSESSMENT:   CLINICAL IMPRESSION: Pt arrives  to therapy with moderate pain of cervical spine and bilateral UE at rest with pain in left UE greater than right. Patient presents with moderate point tenderness and soft tissue restrictions of bilateral upper trap, SCM, and levator scap. Patient also with moderate hypomobility noted C3-7. Patient responds well to manual therapy and grade II-III cervical upglides bilaterally reporting less tension and pain with cervical rotation and extension. Patient performs extension and rotations SNAGs as well as rows, pulldowns, and scapular depression to improve postural control. Patient requires min tactile cueing but denies reproduction of pain. Patient continues to require skilled therapy services to address deficits and return to prior level of function.    OBJECTIVE IMPAIRMENTS: decreased activity tolerance, decreased endurance, decreased mobility, decreased ROM, decreased strength, impaired UE functional use, improper body mechanics, postural dysfunction, and pain.    ACTIVITY LIMITATIONS: carrying, lifting, sitting, standing, reach over head, and hygiene/grooming   PARTICIPATION LIMITATIONS: meal prep, cleaning, laundry, and driving   PERSONAL FACTORS: Time since onset of injury/illness/exacerbation and 1-2 comorbidities: psychosocial comorbidities, HTN  are also affecting patient's functional outcome.    REHAB POTENTIAL: Fair given chronicity/comorbidities   CLINICAL DECISION MAKING: Evolving/moderate complexity   EVALUATION COMPLEXITY: Moderate     GOALS: Goals reviewed with patient? No   SHORT TERM GOALS: Target date: 05/03/2023 Pt will demonstrate appropriate understanding and performance of initially prescribed HEP in order to facilitate improved independence with management of symptoms.  Baseline: HEP TBD Goal status: INITIAL    2. Pt will score greater than or equal to 60 on FOTO in order to demonstrate improved perception of function due to symptoms.            Baseline: 56             Goal status: INITIAL     LONG TERM GOALS: Target date: 05/31/2023 Pt will score 65 on FOTO in order to demonstrate improved perception of function due to symptoms. Baseline: 56 Goal status: INITIAL   2. Pt will demonstrate at least 60 degrees of active cervical rotation ROM towards L in order to demonstrate improved environmental awareness and safety with driving.  Baseline: see ROM chart above Goal status: INITIAL   3. Pt will demonstrate at least 140 deg of painless shoulder elevation bilaterally to improve tolerance with overhead reaching and ADLs. Baseline: see ROM chart above Goal status: INITIAL    4. Pt will endorse waking less than or equal to 2 times per night on average over past week in order to improve overall health and quality of life.             Baseline: waking 3-4x/night on average due to positional pain  Goal status: INITIAL    5. Pt will report at least 50% decrease in overall pain levels in past week in order to facilitate improved tolerance to basic ADLs/mobility.             Baseline: 0-10/10 in past week            Goal status: INITIAL     6. Pt will demonstrate appropriate performance of final prescribed HEP in order to facilitate improved self-management of symptoms post-discharge.             Baseline: HEP TBD            Goal status: INITIAL       PLAN:   PT FREQUENCY: 2x/week   PT DURATION: 8 weeks   PLANNED INTERVENTIONS: Therapeutic exercises, Therapeutic activity, Neuromuscular re-education, Balance training, Gait training, Patient/Family education, Self Care, Joint mobilization, Joint manipulation, Stair training, Aquatic Therapy, Electrical stimulation, Spinal manipulation, Spinal mobilization, Cryotherapy, Moist heat, Taping, Manual therapy, and Re-evaluation   PLAN FOR NEXT SESSION: establish HEP, emphasis on postural extension and cervical mobility. Look at nerve sensitization. Manual PRN as indicated, seems to respond well to palpation  on eval   Erskine Emery Toran Murch, PT, DPT 04/16/2023, 11:10 AM

## 2023-04-18 ENCOUNTER — Encounter (HOSPITAL_COMMUNITY): Payer: Self-pay

## 2023-04-18 ENCOUNTER — Telehealth (HOSPITAL_COMMUNITY): Payer: Self-pay | Admitting: Mental Health

## 2023-04-18 ENCOUNTER — Ambulatory Visit (HOSPITAL_COMMUNITY): Payer: Medicare HMO | Admitting: Mental Health

## 2023-04-18 NOTE — Telephone Encounter (Signed)
Therapist sent link for tele-therapy session. No response after x 10 minutes. Called contact number in chart; however unable to get through due to therapist contacting from private number (not accepted.) Unable to leave voicemail. NS

## 2023-04-21 ENCOUNTER — Telehealth: Payer: Self-pay | Admitting: Physical Therapy

## 2023-04-21 ENCOUNTER — Ambulatory Visit: Payer: Medicare HMO | Admitting: Physical Therapy

## 2023-04-21 NOTE — Telephone Encounter (Signed)
Spoke with pt and informed of NS today. Pt stated he forgot about appt but does intend to come to subsequent appt.  Pt  given information about remaining appt and was reminded of attendance policy.  Pt verbalized understanding and expressed sadness over missing his appt.    Garen Lah, PT, [facility] Certified Exercise Expert for the Aging Adult  04/21/23 4:25 PM Phone: [phone] Fax: [phone]

## 2023-04-27 ENCOUNTER — Ambulatory Visit: Payer: Medicare HMO | Admitting: Physical Therapy

## 2023-04-27 DIAGNOSIS — R293 Abnormal posture: Secondary | ICD-10-CM

## 2023-04-27 DIAGNOSIS — M542 Cervicalgia: Secondary | ICD-10-CM | POA: Diagnosis not present

## 2023-04-27 DIAGNOSIS — M25511 Pain in right shoulder: Secondary | ICD-10-CM

## 2023-04-27 NOTE — Therapy (Signed)
OUTPATIENT PHYSICAL THERAPY TREATMENT NOTE   [name] MRN: [id number] DOB:1976-02-24, 47 y.o., adult Today's Date: 04/27/2023  PCP: Lerry Liner REFERRING PROVIDER: [name]  END OF SESSION:   PT End of Session - 04/27/23 1403     Visit Number 3    Number of Visits 17    Date for PT Re-Evaluation 05/31/23    Authorization Type [employer]    Authorization Time Period no auth, 60 VL annual    Progress Note Due on Visit 10    PT Start Time 1400    PT Stop Time 1438    PT Time Calculation (min) 38 min             Past Medical History:  Diagnosis Date   Bipolar 1 disorder (HCC)    Hyperlipemia    Hypertension    Schizoaffective disorder (HCC)    No past surgical history on file. Patient Active Problem List   Diagnosis Date Noted   PTSD (post-traumatic stress disorder) 03/11/2023   Schizoaffective disorder, bipolar type (HCC) 02/08/2023   Gender identity disorder 08/01/2013   Dyslipidemia 08/01/2013   Schizophrenia (HCC) 08/01/2013   Unspecified essential hypertension 08/01/2013    REFERRING DIAG: Cervicalgia  THERAPY DIAG:  Cervicalgia  Bilateral shoulder pain, unspecified chronicity  Abnormal posture  Rationale for Evaluation and Treatment Rehabilitation  PERTINENT HISTORY: Schizophrenia, PTSD  PRECAUTIONS: None  SUBJECTIVE:                                                                                                                                                                                      SUBJECTIVE STATEMENT: Sometimes the neck feels better and sometimes it feels the same. I still have N/T in bilateral ulna side of arms.     PAIN:  Are you having pain? Yes: NPRS scale: 7/10 Pain location: Neck and bilateral UE Pain description: numbness/tingling, sharp burning pain Aggravating factors: holding weight/objects for too long. Reaching overhead Relieving factors: Holding head in sidebend in opposite  direction from pain   OBJECTIVE: (objective measures completed at initial evaluation unless otherwise dated)   OBJECTIVE:      DIAGNOSTIC FINDINGS:  No recent imaging in chart - pt states that an XR was taken which showed nerve irritation and arthritis    PATIENT SURVEYS:  FOTO 56 current, 65 predicted   COGNITION: Overall cognitive status: Within functional limits for tasks assessed   SENSATION/NEURO: Light touch intact BLE although mildly diminished C5, C6, C8 BIL Negative hoffman/tromner sign BUE, no apparent coordination deficits with object manipulation Gait mechanics WNL, no ataxia   POSTURE: B UT elevation, tendency to hold head towards  either side in resting position, increased kyphosis   PALPATION: Notable concordant stiffness B cervicothoracic musculature, no midline pain or gross bony deformities. Most stiffness in L LS/UT   CERVICAL ROM:    Active ROM AROM (deg) eval   Flexion 100% mild pain   Extension 100% relief    Right lateral flexion     Left lateral flexion     Right rotation 61 deg 75  Left rotation 40 deg * 75   (Blank rows = not tested) (Key: WFL = within functional limits not formally assessed, * = concordant pain, s = stiffness/stretching sensation, NT = not tested)    UPPER EXTREMITY ROM:   Active ROM Right eval Left eval  Shoulder flexion 150deg (painful from 100 deg onward) 148 deg (painful 108 deg onward)  Shoulder abduction      Shoulder internal rotation      Shoulder external rotation      Elbow flexion      Elbow extension      Wrist flexion      Wrist extension       (Blank rows = not tested) Comments:    UPPER EXTREMITY MMT:   MMT Right eval Left eval  Shoulder flexion      Shoulder extension      Shoulder abduction      Shoulder extension      Shoulder internal rotation      Shoulder external rotation      Elbow flexion      Elbow extension      Grip strength      (Blank rows = not tested)  (Key: WFL = within  functional limits not formally assessed, * = concordant pain, s = stiffness/stretching sensation, NT = not tested)  Comments:    CERVICAL SPECIAL TESTS:  Deferred given time constraints   FUNCTIONAL TESTS:  Limited overhead reach BUE with pain   TODAY'S TREATMENT:                                                                                                                              [facility] Adult PT Treatment:                                                DATE: 04/27/23 Therapeutic Exercise: UBE L2 2 min each Upper trap stretch  x 2 each Levator  stretch x 2 each  Green band row  Green band extension  Nerve glides - ulnar x 10 each UE  Standing Chin tuck with neck extension x 10  Manual Therapy: TPR bilat upper trap   PT Treatment 04/16/2023 Manual therapy STM bilateral suboccipitals, UT, SCM, levator scap Grade II-III cervical upglides bilateral C3-7  Therex Rows/pull downs GTB 2x10 Scapular depression with ball 1x15 SNAGs (extension and rotation 1x10 each)  [facility] Adult PT Treatment:                                                DATE: 04/05/23 Deferred given increased time w/ pt education/subjective   PATIENT EDUCATION:  Education details: Pt education on PT impairments, prognosis, and POC. Informed consent. Rationale for interventions. Extensive time spent w/ education on role of PT and treatment interventions Person educated: Patient Education method: Explanation, Demonstration, Tactile cues, Verbal cues Education comprehension: verbalized understanding, returned demonstration, verbal cues required, tactile cues required, and needs further education     HOME EXERCISE PROGRAM: Access Code: [email or web address] URL: [email or web address] Date: 04/16/2023 Prepared by: Rinaldo Ratel Diy  Exercises - Cervical Extension AROM with Strap  - 2 x daily - 7 x weekly - 3 sets - 10 reps - Seated Assisted Cervical Rotation with Towel  - 2 x daily - 7 x weekly - 3 sets - 10  reps - Standing Shoulder Row with Anchored Resistance  - 2 x daily - 7 x weekly - 3 sets - 10 reps - Shoulder extension with resistance - Neutral  - 2 x daily - 7 x weekly - 3 sets - 10 reps 04/27/23 - Ulnar Nerve Flossing  - 1 x daily - 7 x weekly - 1 sets - 10 reps - 5 hold   ASSESSMENT:   CLINICAL IMPRESSION: Pt arrives to therapy with 7/10 pain in neck and n/t in arms. Continued  rows and pulldowns  to improve postural control. Introduced Insurance underwriter nerve glides with pt reporting reductio of symptoms in bilat UE right > left. Cervical rotation AROM improved bilateral. Patient continues to require skilled therapy services to address deficits and return to prior level of function.    OBJECTIVE IMPAIRMENTS: decreased activity tolerance, decreased endurance, decreased mobility, decreased ROM, decreased strength, impaired UE functional use, improper body mechanics, postural dysfunction, and pain.    ACTIVITY LIMITATIONS: carrying, lifting, sitting, standing, reach over head, and hygiene/grooming   PARTICIPATION LIMITATIONS: meal prep, cleaning, laundry, and driving   PERSONAL FACTORS: Time since onset of injury/illness/exacerbation and 1-2 comorbidities: psychosocial comorbidities, HTN  are also affecting patient's functional outcome.    REHAB POTENTIAL: Fair given chronicity/comorbidities   CLINICAL DECISION MAKING: Evolving/moderate complexity   EVALUATION COMPLEXITY: Moderate     GOALS: Goals reviewed with patient? No   SHORT TERM GOALS: Target date: 05/03/2023 Pt will demonstrate appropriate understanding and performance of initially prescribed HEP in order to facilitate improved independence with management of symptoms.  Baseline: HEP TBD Goal status: INITIAL    2. Pt will score greater than or equal to 60 on FOTO in order to demonstrate improved perception of function due to symptoms.            Baseline: 56            Goal status: INITIAL     LONG TERM GOALS: Target date:  05/31/2023 Pt will score 65 on FOTO in order to demonstrate improved perception of function due to symptoms. Baseline: 56 Goal status: INITIAL   2. Pt will demonstrate at least 60 degrees of active cervical rotation ROM towards L in order to demonstrate improved environmental awareness and safety with driving.  Baseline: see ROM chart above Goal status: INITIAL   3. Pt will demonstrate at least 140 deg of painless shoulder elevation bilaterally to improve tolerance with  overhead reaching and ADLs. Baseline: see ROM chart above Goal status: INITIAL    4. Pt will endorse waking less than or equal to 2 times per night on average over past week in order to improve overall health and quality of life.             Baseline: waking 3-4x/night on average due to positional pain            Goal status: INITIAL    5. Pt will report at least 50% decrease in overall pain levels in past week in order to facilitate improved tolerance to basic ADLs/mobility.             Baseline: 0-10/10 in past week            Goal status: INITIAL     6. Pt will demonstrate appropriate performance of final prescribed HEP in order to facilitate improved self-management of symptoms post-discharge.             Baseline: HEP TBD            Goal status: INITIAL       PLAN:   PT FREQUENCY: 2x/week   PT DURATION: 8 weeks   PLANNED INTERVENTIONS: Therapeutic exercises, Therapeutic activity, Neuromuscular re-education, Balance training, Gait training, Patient/Family education, Self Care, Joint mobilization, Joint manipulation, Stair training, Aquatic Therapy, Electrical stimulation, Spinal manipulation, Spinal mobilization, Cryotherapy, Moist heat, Taping, Manual therapy, and Re-evaluation   PLAN FOR NEXT SESSION: assess response to nerve glides; establish HEP, emphasis on postural extension and cervical mobility. Look at nerve sensitization. Manual PRN as indicated, seems to respond well to palpation on eval   Jannette Spanner, PTA 04/27/23 2:40 PM Phone: [phone] Fax: [phone]

## 2023-04-30 ENCOUNTER — Ambulatory Visit: Payer: Medicare HMO

## 2023-05-02 ENCOUNTER — Encounter: Payer: Self-pay | Admitting: Physical Therapy

## 2023-05-02 ENCOUNTER — Ambulatory Visit: Payer: Medicare HMO | Admitting: Physical Therapy

## 2023-05-02 DIAGNOSIS — M542 Cervicalgia: Secondary | ICD-10-CM | POA: Diagnosis not present

## 2023-05-02 DIAGNOSIS — R293 Abnormal posture: Secondary | ICD-10-CM

## 2023-05-02 DIAGNOSIS — M25511 Pain in right shoulder: Secondary | ICD-10-CM

## 2023-05-02 NOTE — Therapy (Signed)
OUTPATIENT PHYSICAL THERAPY TREATMENT NOTE   [name] MRN: [id number] DOB:[birth year], 47 y.o., adult Today's Date: 05/02/2023  PCP: Lerry Liner REFERRING PROVIDER: [name]  END OF SESSION:   PT End of Session - 05/02/23 1105     Visit Number 4    Number of Visits 17    Date for PT Re-Evaluation 05/31/23    Authorization Type [employer]    Authorization Time Period no auth, 60 VL annual    Progress Note Due on Visit 10    PT Start Time 1105   late check in   PT Stop Time 1145    PT Time Calculation (min) 40 min    Activity Tolerance Patient tolerated treatment well;No increased pain    Behavior During Therapy WFL for tasks assessed/performed              Past Medical History:  Diagnosis Date   Bipolar 1 disorder (HCC)    Hyperlipemia    Hypertension    Schizoaffective disorder (HCC)    History reviewed. No pertinent surgical history. Patient Active Problem List   Diagnosis Date Noted   PTSD (post-traumatic stress disorder) 03/11/2023   Schizoaffective disorder, bipolar type (HCC) 02/08/2023   Gender identity disorder 08/01/2013   Dyslipidemia 08/01/2013   Schizophrenia (HCC) 08/01/2013   Unspecified essential hypertension 08/01/2013    REFERRING DIAG: Cervicalgia  THERAPY DIAG:  Cervicalgia  Bilateral shoulder pain, unspecified chronicity  Abnormal posture  Rationale for Evaluation and Treatment Rehabilitation  PERTINENT HISTORY: Schizophrenia, PTSD  PRECAUTIONS: None  SUBJECTIVE:                                                                                                                                                                                      SUBJECTIVE STATEMENT:  Pt arrives w/ 3/10 pain, mild UE numbness/tingling. States she did have some increased pain after last session for a couple days but that numbness in her arms is improving and overall she seems to be having less pain.   PAIN:  Are you  having pain? Yes: NPRS scale: 3/10 Pain location: Neck and bilateral UE Pain description: numbness/tingling, sharp burning pain Aggravating factors: holding weight/objects for too long. Reaching overhead Relieving factors: Holding head in sidebend in opposite direction from pain   OBJECTIVE: (objective measures completed at initial evaluation unless otherwise dated)   DIAGNOSTIC FINDINGS:  No recent imaging in chart - pt states that an XR was taken which showed nerve irritation and arthritis    PATIENT SURVEYS:  FOTO 56 current, 65 predicted   COGNITION: Overall cognitive status: Within functional limits for tasks assessed   SENSATION/NEURO: Light  touch intact BLE although mildly diminished C5, C6, C8 BIL Negative hoffman/tromner sign BUE, no apparent coordination deficits with object manipulation Gait mechanics WNL, no ataxia   POSTURE: B UT elevation, tendency to hold head towards either side in resting position, increased kyphosis   PALPATION: Notable concordant stiffness B cervicothoracic musculature, no midline pain or gross bony deformities. Most stiffness in L LS/UT   CERVICAL ROM:    Active ROM AROM (deg) eval   Flexion 100% mild pain   Extension 100% relief    Right lateral flexion     Left lateral flexion     Right rotation 61 deg 75  Left rotation 40 deg * 75   (Blank rows = not tested) (Key: WFL = within functional limits not formally assessed, * = concordant pain, s = stiffness/stretching sensation, NT = not tested)    UPPER EXTREMITY ROM:   Active ROM Right eval Left eval R/L 05/02/23  Shoulder flexion 150deg (painful from 100 deg onward) 148 deg (painful 108 deg onward) 156/158 stretching sensation nonpainful   Shoulder abduction       Shoulder internal rotation       Shoulder external rotation       Elbow flexion       Elbow extension       Wrist flexion       Wrist extension        (Blank rows = not tested) Comments:    UPPER EXTREMITY MMT:    MMT Right eval Left eval  Shoulder flexion      Shoulder extension      Shoulder abduction      Shoulder extension      Shoulder internal rotation      Shoulder external rotation      Elbow flexion      Elbow extension      Grip strength      (Blank rows = not tested)  (Key: WFL = within functional limits not formally assessed, * = concordant pain, s = stiffness/stretching sensation, NT = not tested)  Comments:    CERVICAL SPECIAL TESTS:  Deferred given time constraints   FUNCTIONAL TESTS:  Limited overhead reach BUE with pain   TODAY'S TREATMENT:                                                                                                                              [facility] Adult PT Treatment:                                                DATE: 05/02/23 Therapeutic Exercise: GTB row 2x12 cues for posture and setup  GTB shoulder ext 2x10 cues for posture Seated chin tuck 2x10 cues for form and reduced compensations  Standing swiss ball GH flexion x12 cues for comfortable ROM  Ulnar nerve glides x10 BIL cues for form, monitoring symptoms and modifying accordingly, HEP HEP education, education on relevant anatomy/physiology as it pertains to exercise   [facility] Adult PT Treatment:                                                DATE: 04/27/23 Therapeutic Exercise: UBE L2 2 min each Upper trap stretch  x 2 each Levator  stretch x 2 each  Green band row  Green band extension  Nerve glides - ulnar x 10 each UE  Standing Chin tuck with neck extension x 10  Manual Therapy: TPR bilat upper trap   PT Treatment 04/16/2023 Manual therapy STM bilateral suboccipitals, UT, SCM, levator scap Grade II-III cervical upglides bilateral C3-7  Therex Rows/pull downs GTB 2x10 Scapular depression with ball 1x15 SNAGs (extension and rotation 1x10 each)  [facility] Adult PT Treatment:                                                DATE: 04/05/23 Deferred given increased time w/ pt  education/subjective   PATIENT EDUCATION:  Education details: rationale for interventions, relevant anatomy/physiology Person educated: Patient Education method: Explanation, Demonstration, Tactile cues, Verbal cues Education comprehension: verbalized understanding, returned demonstration, verbal cues required, tactile cues required, and needs further education     HOME EXERCISE PROGRAM: Access Code: [email or web address] URL: [email or web address] Date: 04/16/2023 Prepared by: Rinaldo Ratel Diy  Exercises - Cervical Extension AROM with Strap  - 2 x daily - 7 x weekly - 3 sets - 10 reps - Seated Assisted Cervical Rotation with Towel  - 2 x daily - 7 x weekly - 3 sets - 10 reps - Standing Shoulder Row with Anchored Resistance  - 2 x daily - 7 x weekly - 3 sets - 10 reps - Shoulder extension with resistance - Neutral  - 2 x daily - 7 x weekly - 3 sets - 10 reps 04/27/23 - Ulnar Nerve Flossing  - 1 x daily - 7 x weekly - 1 sets - 10 reps - 5 hold   ASSESSMENT:   CLINICAL IMPRESSION: Pt arrives w/ 3/10 neck/shoulder pain, no significant UE pain but does have mild tingling . Continuing to work on Educational psychologist which pt tolerates well. Modification for nerve glides at end of session with education on appropriate performance as pt notes she has had some increased burning with these at home. GH AROM improving and painless, see above. No adverse events, departs with report of 3/10 pain and resolution of UE numbness/tingling at end of session. Pt departs today's session in no acute distress, all voiced questions/concerns addressed appropriately from PT perspective.      OBJECTIVE IMPAIRMENTS: decreased activity tolerance, decreased endurance, decreased mobility, decreased ROM, decreased strength, impaired UE functional use, improper body mechanics, postural dysfunction, and pain.    ACTIVITY LIMITATIONS: carrying, lifting, sitting, standing, reach over head, and  hygiene/grooming   PARTICIPATION LIMITATIONS: meal prep, cleaning, laundry, and driving   PERSONAL FACTORS: Time since onset of injury/illness/exacerbation and 1-2 comorbidities: psychosocial comorbidities, HTN  are also affecting patient's functional outcome.    REHAB POTENTIAL: Fair given chronicity/comorbidities   CLINICAL DECISION MAKING: Evolving/moderate complexity   EVALUATION COMPLEXITY: Moderate  GOALS: Goals reviewed with patient? No   SHORT TERM GOALS: Target date: 05/03/2023 Pt will demonstrate appropriate understanding and performance of initially prescribed HEP in order to facilitate improved independence with management of symptoms.  Baseline: HEP TBD 05/02/23: good HEP adherence endorsed  Goal status: MET    2. Pt will score greater than or equal to 60 on FOTO in order to demonstrate improved perception of function due to symptoms.            Baseline: 56  05/02/23: deferred today, only visit 4             Goal status: ONGOING    LONG TERM GOALS: Target date: 05/31/2023 Pt will score 65 on FOTO in order to demonstrate improved perception of function due to symptoms. Baseline: 56 Goal status: INITIAL   2. Pt will demonstrate at least 60 degrees of active cervical rotation ROM towards L in order to demonstrate improved environmental awareness and safety with driving.  Baseline: see ROM chart above Goal status: INITIAL   3. Pt will demonstrate at least 140 deg of painless shoulder elevation bilaterally to improve tolerance with overhead reaching and ADLs. Baseline: see ROM chart above Goal status: INITIAL    4. Pt will endorse waking less than or equal to 2 times per night on average over past week in order to improve overall health and quality of life.             Baseline: waking 3-4x/night on average due to positional pain            Goal status: INITIAL    5. Pt will report at least 50% decrease in overall pain levels in past week in order to facilitate  improved tolerance to basic ADLs/mobility.             Baseline: 0-10/10 in past week            Goal status: INITIAL     6. Pt will demonstrate appropriate performance of final prescribed HEP in order to facilitate improved self-management of symptoms post-discharge.             Baseline: HEP TBD            Goal status: INITIAL       PLAN:   PT FREQUENCY: 2x/week   PT DURATION: 8 weeks   PLANNED INTERVENTIONS: Therapeutic exercises, Therapeutic activity, Neuromuscular re-education, Balance training, Gait training, Patient/Family education, Self Care, Joint mobilization, Joint manipulation, Stair training, Aquatic Therapy, Electrical stimulation, Spinal manipulation, Spinal mobilization, Cryotherapy, Moist heat, Taping, Manual therapy, and Re-evaluation   PLAN FOR NEXT SESSION: review/update HEP PRN. Periscapular/cervical stability. Symptom modification strategies as appropriate, continue looking at nerve glides.    Ashley Murrain PT, DPT 05/02/2023 11:55 AM

## 2023-05-04 ENCOUNTER — Ambulatory Visit: Payer: Medicare HMO | Admitting: Physical Therapy

## 2023-05-14 ENCOUNTER — Ambulatory Visit: Payer: Medicare HMO

## 2023-05-14 DIAGNOSIS — M25511 Pain in right shoulder: Secondary | ICD-10-CM

## 2023-05-14 DIAGNOSIS — R293 Abnormal posture: Secondary | ICD-10-CM

## 2023-05-14 DIAGNOSIS — M542 Cervicalgia: Secondary | ICD-10-CM | POA: Diagnosis not present

## 2023-05-14 NOTE — Therapy (Signed)
OUTPATIENT PHYSICAL THERAPY TREATMENT NOTE   [name] MRN: [id number] DOB:1975-12-12, 47 y.o., adult Today's Date: 05/14/2023  PCP: Lerry Liner REFERRING PROVIDER: [name]  END OF SESSION:   PT End of Session - 05/14/23 1035     Visit Number 6    Number of Visits 17    Date for PT Re-Evaluation 05/31/23    Authorization Type [employer]    Authorization Time Period no auth, 60 VL annual    Progress Note Due on Visit 10    PT Start Time 1032    PT Stop Time 1110    PT Time Calculation (min) 38 min    Activity Tolerance Patient tolerated treatment well;No increased pain    Behavior During Therapy WFL for tasks assessed/performed               Past Medical History:  Diagnosis Date   Bipolar 1 disorder (HCC)    Hyperlipemia    Hypertension    Schizoaffective disorder (HCC)    History reviewed. No pertinent surgical history. Patient Active Problem List   Diagnosis Date Noted   PTSD (post-traumatic stress disorder) 03/11/2023   Schizoaffective disorder, bipolar type (HCC) 02/08/2023   Gender identity disorder 08/01/2013   Dyslipidemia 08/01/2013   Schizophrenia (HCC) 08/01/2013   Unspecified essential hypertension 08/01/2013    REFERRING DIAG: Cervicalgia  THERAPY DIAG:  Cervicalgia  Bilateral shoulder pain, unspecified chronicity  Abnormal posture  Rationale for Evaluation and Treatment Rehabilitation  PERTINENT HISTORY: Schizophrenia, PTSD  PRECAUTIONS: None  SUBJECTIVE:                                                                                                                                                                                      SUBJECTIVE STATEMENT:  Patient states that she is still having numbness/tingling her arms. States her symptoms seem to be about the same. States that her exercises can help temporarily with her symptoms. States she's not sure how much she's improved  PAIN:  Are you having  pain? Yes: NPRS scale: 3/10 Pain location: Neck and bilateral UE Pain description: numbness/tingling, sharp burning pain Aggravating factors: holding weight/objects for too long. Reaching overhead Relieving factors: Holding head in sidebend in opposite direction from pain   OBJECTIVE: (objective measures completed at initial evaluation unless otherwise dated)   DIAGNOSTIC FINDINGS:  No recent imaging in chart - pt states that an XR was taken which showed nerve irritation and arthritis    PATIENT SURVEYS:  FOTO 56 current, 65 predicted   COGNITION: Overall cognitive status: Within functional limits for tasks assessed   SENSATION/NEURO: Light touch intact BLE although mildly diminished  C5, C6, C8 BIL Negative hoffman/tromner sign BUE, no apparent coordination deficits with object manipulation Gait mechanics WNL, no ataxia   POSTURE: B UT elevation, tendency to hold head towards either side in resting position, increased kyphosis   PALPATION: Notable concordant stiffness B cervicothoracic musculature, no midline pain or gross bony deformities. Most stiffness in L LS/UT   CERVICAL ROM:    Active ROM AROM (deg) eval   Flexion 100% mild pain   Extension 100% relief    Right lateral flexion     Left lateral flexion     Right rotation 61 deg 75  Left rotation 40 deg * 75   (Blank rows = not tested) (Key: WFL = within functional limits not formally assessed, * = concordant pain, s = stiffness/stretching sensation, NT = not tested)    UPPER EXTREMITY ROM:   Active ROM Right eval Left eval R/L 05/02/23  Shoulder flexion 150deg (painful from 100 deg onward) 148 deg (painful 108 deg onward) 156/158 stretching sensation nonpainful   Shoulder abduction       Shoulder internal rotation       Shoulder external rotation       Elbow flexion       Elbow extension       Wrist flexion       Wrist extension        (Blank rows = not tested) Comments:    UPPER EXTREMITY MMT:   MMT  Right eval Left eval  Shoulder flexion      Shoulder extension      Shoulder abduction      Shoulder extension      Shoulder internal rotation      Shoulder external rotation      Elbow flexion      Elbow extension      Grip strength      (Blank rows = not tested)  (Key: WFL = within functional limits not formally assessed, * = concordant pain, s = stiffness/stretching sensation, NT = not tested)  Comments:    CERVICAL SPECIAL TESTS:  Deferred given time constraints   FUNCTIONAL TESTS:  Limited overhead reach BUE with pain   TODAY'S TREATMENT:                                                                                                                              [facility] Adult PT Treatment:                                                DATE:  05/14/2023 Therapeutic exercise:  3x10 scaption raises with chin tuck 2lbs 2x15 reps extension SNAGs 2x10 itsy bitsy spider with GTB 3x10 supine serratus punches 4lbs CW/CCW 1kg med ball wall circles 3x14 rows with cable column 10lbs  Manual Therapy STM left upper trap, suboccipitals,  levator scap Median nerve glides  05/02/23 Therapeutic Exercise: GTB row 2x12 cues for posture and setup  GTB shoulder ext 2x10 cues for posture Seated chin tuck 2x10 cues for form and reduced compensations  Standing swiss ball GH flexion x12 cues for comfortable ROM  Ulnar nerve glides x10 BIL cues for form, monitoring symptoms and modifying accordingly, HEP HEP education, education on relevant anatomy/physiology as it pertains to exercise   [facility] Adult PT Treatment:                                                DATE:  04/27/23 Therapeutic Exercise: UBE L2 2 min each Upper trap stretch  x 2 each Levator  stretch x 2 each  Green band row  Green band extension  Nerve glides - ulnar x 10 each UE  Standing Chin tuck with neck extension x 10  Manual Therapy: TPR bilat upper trap   PT Treatment 04/16/2023 Manual therapy STM bilateral  suboccipitals, UT, SCM, levator scap Grade II-III cervical upglides bilateral C3-7  Therex Rows/pull downs GTB 2x10 Scapular depression with ball 1x15 SNAGs (extension and rotation 1x10 each)  [facility] Adult PT Treatment:                                                DATE: 04/05/23 Deferred given increased time w/ pt education/subjective   PATIENT EDUCATION:  Education details: rationale for interventions, relevant anatomy/physiology Person educated: Patient Education method: Explanation, Demonstration, Tactile cues, Verbal cues Education comprehension: verbalized understanding, returned demonstration, verbal cues required, tactile cues required, and needs further education     HOME EXERCISE PROGRAM: Access Code: [email or web address] URL: [email or web address] Date: 04/16/2023 Prepared by: Rinaldo Ratel Tenia Goh  Exercises - Cervical Extension AROM with Strap  - 2 x daily - 7 x weekly - 3 sets - 10 reps - Seated Assisted Cervical Rotation with Towel  - 2 x daily - 7 x weekly - 3 sets - 10 reps - Standing Shoulder Row with Anchored Resistance  - 2 x daily - 7 x weekly - 3 sets - 10 reps - Shoulder extension with resistance - Neutral  - 2 x daily - 7 x weekly - 3 sets - 10 reps 04/27/23 - Ulnar Nerve Flossing  - 1 x daily - 7 x weekly - 1 sets - 10 reps - 5 hold   ASSESSMENT:   CLINICAL IMPRESSION: Patient presents to therapy with mild pain symptoms but continued numbness and tingling. Patient responded well to manual therapy and median nerve glides reporting less numbness of left thumb following upper trap release and nerve glides. Patient able to tolerate functional progressions of scaption raises and wall ball circles without increase in pain but requires frequent verbal and tactile cueing to prevent upper trap hiking. Patient still unable to perform all work related tasks without symptom reproduction. Patient continues to require skilled PT services to address deficits and return to  prior level of function.   OBJECTIVE IMPAIRMENTS: decreased activity tolerance, decreased endurance, decreased mobility, decreased ROM, decreased strength, impaired UE functional use, improper body mechanics, postural dysfunction, and pain.    ACTIVITY LIMITATIONS: carrying, lifting, sitting, standing, reach over head, and hygiene/grooming   PARTICIPATION LIMITATIONS: meal prep, cleaning, laundry,  and driving   PERSONAL FACTORS: Time since onset of injury/illness/exacerbation and 1-2 comorbidities: psychosocial comorbidities, HTN  are also affecting patient's functional outcome.    REHAB POTENTIAL: Fair given chronicity/comorbidities   CLINICAL DECISION MAKING: Evolving/moderate complexity   EVALUATION COMPLEXITY: Moderate     GOALS: Goals reviewed with patient? No   SHORT TERM GOALS: Target date: 05/03/2023 Pt will demonstrate appropriate understanding and performance of initially prescribed HEP in order to facilitate improved independence with management of symptoms.  Baseline: HEP TBD 05/02/23: good HEP adherence endorsed  Goal status: MET    2. Pt will score greater than or equal to 60 on FOTO in order to demonstrate improved perception of function due to symptoms.            Baseline: 56  05/02/23: deferred today, only visit 4             Goal status: ONGOING    LONG TERM GOALS: Target date: 05/31/2023 Pt will score 65 on FOTO in order to demonstrate improved perception of function due to symptoms. Baseline: 56 Goal status: INITIAL   2. Pt will demonstrate at least 60 degrees of active cervical rotation ROM towards L in order to demonstrate improved environmental awareness and safety with driving.  Baseline: see ROM chart above Goal status: INITIAL   3. Pt will demonstrate at least 140 deg of painless shoulder elevation bilaterally to improve tolerance with overhead reaching and ADLs. Baseline: see ROM chart above Goal status: INITIAL    4. Pt will endorse waking less  than or equal to 2 times per night on average over past week in order to improve overall health and quality of life.             Baseline: waking 3-4x/night on average due to positional pain            Goal status: INITIAL    5. Pt will report at least 50% decrease in overall pain levels in past week in order to facilitate improved tolerance to basic ADLs/mobility.             Baseline: 0-10/10 in past week            Goal status: INITIAL     6. Pt will demonstrate appropriate performance of final prescribed HEP in order to facilitate improved self-management of symptoms post-discharge.             Baseline: HEP TBD            Goal status: INITIAL       PLAN:   PT FREQUENCY: 2x/week   PT DURATION: 8 weeks   PLANNED INTERVENTIONS: Therapeutic exercises, Therapeutic activity, Neuromuscular re-education, Balance training, Gait training, Patient/Family education, Self Care, Joint mobilization, Joint manipulation, Stair training, Aquatic Therapy, Electrical stimulation, Spinal manipulation, Spinal mobilization, Cryotherapy, Moist heat, Taping, Manual therapy, and Re-evaluation   PLAN FOR NEXT SESSION: review/update HEP PRN. Periscapular/cervical stability. Symptom modification strategies as appropriate, continue looking at nerve glides.    Dellia Nims Winson Eichorn PT, DPT 05/14/2023 12:01 PM

## 2023-05-17 NOTE — Therapy (Addendum)
OUTPATIENT PHYSICAL THERAPY TREATMENT NOTE + NO VISIT DISCHARGE SUMMARY (see below)    [name] MRN: [id number] DOB:06/03/1976, 47 y.o., adult Today's Date: 05/18/2023  PCP: Lerry Liner REFERRING PROVIDER: [name]  END OF SESSION:   PT End of Session - 05/18/23 1147     Visit Number 7    Number of Visits 17    Date for PT Re-Evaluation 05/31/23    Authorization Type [employer]    Authorization Time Period no auth, 60 VL annual    Progress Note Due on Visit 10    PT Start Time 1147    PT Stop Time 1231    PT Time Calculation (min) 44 min    Activity Tolerance No increased pain    Behavior During Therapy WFL for tasks assessed/performed                Past Medical History:  Diagnosis Date   Bipolar 1 disorder (HCC)    Hyperlipemia    Hypertension    Schizoaffective disorder (HCC)    History reviewed. No pertinent surgical history. Patient Active Problem List   Diagnosis Date Noted   PTSD (post-traumatic stress disorder) 03/11/2023   Schizoaffective disorder, bipolar type (HCC) 02/08/2023   Gender identity disorder 08/01/2013   Dyslipidemia 08/01/2013   Schizophrenia (HCC) 08/01/2013   Unspecified essential hypertension 08/01/2013    REFERRING DIAG: Cervicalgia  THERAPY DIAG:  Cervicalgia  Bilateral shoulder pain, unspecified chronicity  Abnormal posture  Rationale for Evaluation and Treatment Rehabilitation  PERTINENT HISTORY: Schizophrenia, PTSD  PRECAUTIONS: None  SUBJECTIVE:                                                                                                                                                                                      SUBJECTIVE STATEMENT:  Pt states she has noticed more paresthesia in R arm distal to elbow after doing rows the other day - states she didn't change anything but noticed later that day while driving symptoms came on. States she has some improved pain overall despite  this, about 15-20% improvement since start of care. Exercises continue to relieve symptoms transiently.   PAIN:  Are you having pain? Yes: NPRS scale: 5-6/10 Pain location: Neck and bilateral UE Pain description: numbness/tingling, sharp burning pain Aggravating factors: holding weight/objects for too long. Reaching overhead Relieving factors: Holding head in sidebend in opposite direction from pain   OBJECTIVE: (objective measures completed at initial evaluation unless otherwise dated)   DIAGNOSTIC FINDINGS:  No recent imaging in chart - pt states that an XR was taken which showed nerve irritation and arthritis    PATIENT SURVEYS:  FOTO 56 current, 65 predicted 55 current 05/18/23   COGNITION: Overall cognitive status: Within functional limits for tasks assessed   SENSATION/NEURO: Light touch intact BLE although mildly diminished C5, C6, C8 BIL Negative hoffman/tromner sign BUE, no apparent coordination deficits with object manipulation Gait mechanics WNL, no ataxia  05/18/23: light touch intact BIL UE, remains diminished C5, C6, C8 R>L Negative hoffman/tromner sign BIL UE, no issues with dysdiadochokinesia   POSTURE: B UT elevation, tendency to hold head towards either side in resting position, increased kyphosis   PALPATION: Notable concordant stiffness B cervicothoracic musculature, no midline pain or gross bony deformities. Most stiffness in L LS/UT   CERVICAL ROM:    Active ROM AROM (deg) eval   Flexion 100% mild pain   Extension 100% relief    Right lateral flexion     Left lateral flexion     Right rotation 61 deg 75  Left rotation 40 deg * 75   (Blank rows = not tested) (Key: WFL = within functional limits not formally assessed, * = concordant pain, s = stiffness/stretching sensation, NT = not tested)    UPPER EXTREMITY ROM:   Active ROM Right eval Left eval R/L 05/02/23  Shoulder flexion 150deg (painful from 100 deg onward) 148 deg (painful 108 deg onward)  156/158 stretching sensation nonpainful   Shoulder abduction       Shoulder internal rotation       Shoulder external rotation       Elbow flexion       Elbow extension       Wrist flexion       Wrist extension        (Blank rows = not tested) Comments:    UPPER EXTREMITY MMT:   MMT Right eval Left eval  Shoulder flexion      Shoulder extension      Shoulder abduction      Shoulder extension      Shoulder internal rotation      Shoulder external rotation      Elbow flexion      Elbow extension      Grip strength      (Blank rows = not tested)  (Key: WFL = within functional limits not formally assessed, * = concordant pain, s = stiffness/stretching sensation, NT = not tested)  Comments:    CERVICAL SPECIAL TESTS:  Deferred given time constraints   FUNCTIONAL TESTS:  Limited overhead reach BUE with pain   TODAY'S TREATMENT:                                                                                                                              [facility] Adult PT Treatment:  DATE: 05/18/23 Therapeutic Exercise: Seated chin tuck x15 cues for posture  Supine scaption BIL UE x8 Supine serratus punch 4# BIL x8 Significant time spent w/ discussion/education on relevant anatomy/physiology as it pertains to exercise interventions, symptom behavior with exercise and mobility  Manual Therapy: Seated; STM BIL upper traps, levator scapulae, rhomboids. Trigger point release throughout   Therapeutic Activity MSK assessment + education on symptom behavior, activity tolerance, monitoring symptoms, communication w/ provider as appropriate   [facility] Adult PT Treatment:                                                DATE:  05/14/2023 Therapeutic exercise:  3x10 scaption raises with chin tuck 2lbs 2x15 reps extension SNAGs 2x10 itsy bitsy spider with GTB 3x10 supine serratus punches 4lbs CW/CCW 1kg med ball wall circles 3x14 rows with cable  column 10lbs  Manual Therapy STM left upper trap, suboccipitals, levator scap Median nerve glides  05/02/23 Therapeutic Exercise: GTB row 2x12 cues for posture and setup  GTB shoulder ext 2x10 cues for posture Seated chin tuck 2x10 cues for form and reduced compensations  Standing swiss ball GH flexion x12 cues for comfortable ROM  Ulnar nerve glides x10 BIL cues for form, monitoring symptoms and modifying accordingly, HEP HEP education, education on relevant anatomy/physiology as it pertains to exercise   [facility] Adult PT Treatment:                                                DATE:  04/27/23 Therapeutic Exercise: UBE L2 2 min each Upper trap stretch  x 2 each Levator  stretch x 2 each  Green band row  Green band extension  Nerve glides - ulnar x 10 each UE  Standing Chin tuck with neck extension x 10  Manual Therapy: TPR bilat upper trap   PT Treatment 04/16/2023 Manual therapy STM bilateral suboccipitals, UT, SCM, levator scap Grade II-III cervical upglides bilateral C3-7  Therex Rows/pull downs GTB 2x10 Scapular depression with ball 1x15 SNAGs (extension and rotation 1x10 each)  [facility] Adult PT Treatment:                                                DATE: 04/05/23 Deferred given increased time w/ pt education/subjective   PATIENT EDUCATION:  Education details: rationale for interventions, relevant anatomy/physiology Person educated: Patient Education method: Explanation, Demonstration, Tactile cues, Verbal cues Education comprehension: verbalized understanding, returned demonstration, verbal cues required, tactile cues required, and needs further education     HOME EXERCISE PROGRAM: Access Code: [email or web address] URL: [email or web address] Date: 04/16/2023 Prepared by: Rinaldo Ratel Diy  Exercises - Cervical Extension AROM with Strap  - 2 x daily - 7 x weekly - 3 sets - 10 reps - Seated Assisted Cervical Rotation with Towel  - 2 x daily - 7 x weekly - 3  sets - 10 reps - Standing Shoulder Row with Anchored Resistance  - 2 x daily - 7 x weekly - 3 sets - 10 reps - Shoulder extension with resistance - Neutral  - 2 x daily - 7 x  weekly - 3 sets - 10 reps 04/27/23 - Ulnar Nerve Flossing  - 1 x daily - 7 x weekly - 1 sets - 10 reps - 5 hold   ASSESSMENT:   CLINICAL IMPRESSION: 05/18/2023 Pt arrives w/ 5-6/10 pain, does note some increased numbness in R arm over past few days. Neuro exam remains reassuring and overall unchanged compared to initial eval, education provided on monitoring symptoms and communication w/ provider if worsens. Pt does require increased rest breaks and regression of program given increased symptom irritability today, although as session goes on pt endorses gradually improving symptoms, particularly with manual. Reproduction of UE symptoms with palpation of trigger points and subsequent improvement. No adverse events, reports improved symptoms on departure. Pt departs today's session in no acute distress, all voiced questions/concerns addressed appropriately from PT perspective.      OBJECTIVE IMPAIRMENTS: decreased activity tolerance, decreased endurance, decreased mobility, decreased ROM, decreased strength, impaired UE functional use, improper body mechanics, postural dysfunction, and pain.    ACTIVITY LIMITATIONS: carrying, lifting, sitting, standing, reach over head, and hygiene/grooming   PARTICIPATION LIMITATIONS: meal prep, cleaning, laundry, and driving   PERSONAL FACTORS: Time since onset of injury/illness/exacerbation and 1-2 comorbidities: psychosocial comorbidities, HTN  are also affecting patient's functional outcome.    REHAB POTENTIAL: Fair given chronicity/comorbidities   CLINICAL DECISION MAKING: Evolving/moderate complexity   EVALUATION COMPLEXITY: Moderate     GOALS: Goals reviewed with patient? No   SHORT TERM GOALS: Target date: 05/03/2023 Pt will demonstrate appropriate understanding and performance  of initially prescribed HEP in order to facilitate improved independence with management of symptoms.  Baseline: HEP TBD 05/02/23: good HEP adherence endorsed  Goal status: MET    2. Pt will score greater than or equal to 60 on FOTO in order to demonstrate improved perception of function due to symptoms.            Baseline: 56  05/02/23: deferred today, only visit 4   05/18/23: 55             Goal status: ONGOING    LONG TERM GOALS: Target date: 05/31/2023 Pt will score 65 on FOTO in order to demonstrate improved perception of function due to symptoms. Baseline: 56 Goal status: INITIAL   2. Pt will demonstrate at least 60 degrees of active cervical rotation ROM towards L in order to demonstrate improved environmental awareness and safety with driving.  Baseline: see ROM chart above Goal status: INITIAL   3. Pt will demonstrate at least 140 deg of painless shoulder elevation bilaterally to improve tolerance with overhead reaching and ADLs. Baseline: see ROM chart above Goal status: INITIAL    4. Pt will endorse waking less than or equal to 2 times per night on average over past week in order to improve overall health and quality of life.             Baseline: waking 3-4x/night on average due to positional pain            Goal status: INITIAL    5. Pt will report at least 50% decrease in overall pain levels in past week in order to facilitate improved tolerance to basic ADLs/mobility.             Baseline: 0-10/10 in past week            Goal status: INITIAL     6. Pt will demonstrate appropriate performance of final prescribed HEP in order to facilitate improved self-management of symptoms  post-discharge.             Baseline: HEP TBD            Goal status: INITIAL       PLAN:   PT FREQUENCY: 2x/week   PT DURATION: 8 weeks   PLANNED INTERVENTIONS: Therapeutic exercises, Therapeutic activity, Neuromuscular re-education, Balance training, Gait training, Patient/Family education,  Self Care, Joint mobilization, Joint manipulation, Stair training, Aquatic Therapy, Electrical stimulation, Spinal manipulation, Spinal mobilization, Cryotherapy, Moist heat, Taping, Manual therapy, and Re-evaluation   PLAN FOR NEXT SESSION: review/update HEP PRN. Periscapular/cervical stability. Symptom modification strategies as appropriate, continue looking at nerve glides.     Ashley Murrain PT, DPT 05/18/2023 12:35 PM     Discharge addendum 07/21/2023  PHYSICAL THERAPY DISCHARGE SUMMARY  Visits from Start of Care: 7  Current functional level related to goals / functional outcomes: Unable to be assessed   Remaining deficits: Unable to be assessed   Education / Equipment: Unable to be assessed  Patient goals were unable to be assessed. Patient is being discharged due to not returning since the last visit.  Ashley Murrain PT, DPT 07/21/2023 2:58 PM

## 2023-05-18 ENCOUNTER — Encounter: Payer: Self-pay | Admitting: Physical Therapy

## 2023-05-18 ENCOUNTER — Ambulatory Visit: Payer: Medicare HMO | Attending: Orthopedic Surgery | Admitting: Physical Therapy

## 2023-05-18 DIAGNOSIS — M25512 Pain in left shoulder: Secondary | ICD-10-CM | POA: Diagnosis present

## 2023-05-18 DIAGNOSIS — M542 Cervicalgia: Secondary | ICD-10-CM | POA: Diagnosis present

## 2023-05-18 DIAGNOSIS — R293 Abnormal posture: Secondary | ICD-10-CM | POA: Diagnosis present

## 2023-05-18 DIAGNOSIS — M25511 Pain in right shoulder: Secondary | ICD-10-CM | POA: Insufficient documentation

## 2023-05-23 ENCOUNTER — Ambulatory Visit: Payer: Medicare HMO | Admitting: Physical Therapy

## 2023-05-25 ENCOUNTER — Ambulatory Visit: Payer: Medicare HMO | Admitting: Physical Therapy

## 2023-05-27 ENCOUNTER — Ambulatory Visit: Payer: Medicare HMO | Admitting: Physical Therapy

## 2023-05-31 ENCOUNTER — Ambulatory Visit: Payer: Medicare HMO | Admitting: Physical Therapy

## 2023-06-02 ENCOUNTER — Encounter (HOSPITAL_BASED_OUTPATIENT_CLINIC_OR_DEPARTMENT_OTHER): Payer: Self-pay

## 2023-06-02 ENCOUNTER — Ambulatory Visit: Payer: Medicare HMO | Admitting: Physical Therapy

## 2023-06-02 ENCOUNTER — Emergency Department (HOSPITAL_BASED_OUTPATIENT_CLINIC_OR_DEPARTMENT_OTHER)
Admission: EM | Admit: 2023-06-02 | Discharge: 2023-06-02 | Disposition: A | Discharge status: Home / Self Care | Payer: Medicare HMO | Attending: Emergency Medicine | Admitting: Emergency Medicine

## 2023-06-02 ENCOUNTER — Emergency Department (HOSPITAL_BASED_OUTPATIENT_CLINIC_OR_DEPARTMENT_OTHER): Payer: Medicare HMO

## 2023-06-02 ENCOUNTER — Other Ambulatory Visit: Payer: Self-pay

## 2023-06-02 DIAGNOSIS — R202 Paresthesia of skin: Secondary | ICD-10-CM

## 2023-06-02 DIAGNOSIS — M5416 Radiculopathy, lumbar region: Secondary | ICD-10-CM | POA: Diagnosis not present

## 2023-06-02 DIAGNOSIS — M79661 Pain in right lower leg: Secondary | ICD-10-CM | POA: Diagnosis present

## 2023-06-02 LAB — CBC
HCT: 36.3 % — ABNORMAL LOW (ref 39.0–52.0)
Hemoglobin: 12 g/dL — ABNORMAL LOW (ref 13.0–17.0)
MCH: 30.3 pg (ref 26.0–34.0)
MCHC: 33.1 g/dL (ref 30.0–36.0)
MCV: 91.7 fL (ref 80.0–100.0)
Platelets: 253 10*3/uL (ref 150–400)
RBC: 3.96 MIL/uL — ABNORMAL LOW (ref 4.22–5.81)
RDW: 14.4 % (ref 11.5–15.5)
WBC: 13.8 10*3/uL — ABNORMAL HIGH (ref 4.0–10.5)
nRBC: 0 % (ref 0.0–0.2)

## 2023-06-02 LAB — BASIC METABOLIC PANEL
Anion gap: 10 (ref 5–15)
BUN: 13 mg/dL (ref 6–20)
CO2: 24 mmol/L (ref 22–32)
Calcium: 9.6 mg/dL (ref 8.9–10.3)
Chloride: 103 mmol/L (ref 98–111)
Creatinine, Ser: 0.75 mg/dL (ref 0.61–1.24)
GFR, Estimated: >60 mL/min (ref >60)
Glucose, Bld: 100 mg/dL — ABNORMAL HIGH (ref 70–99)
Potassium: 4.5 mmol/L (ref 3.5–5.1)
Sodium: 137 mmol/L (ref 135–145)

## 2023-06-02 MED ORDER — PREDNISONE 10 MG PO TABS: 20.0000 mg | ORAL_TABLET | Freq: Every day | ORAL | 0 refills | Status: DC

## 2023-06-02 MED ORDER — OXYCODONE-ACETAMINOPHEN 5-325 MG PO TABS
2.0000 | ORAL_TABLET | Freq: Once | ORAL | Status: AC
  Administered 2023-06-02: 2 via ORAL
  Filled 2023-06-02: qty 2

## 2023-06-02 NOTE — Discharge Instructions (Signed)
Please take prednisone as prescribed You can use acetaminophen with this for pain. Return to the emergency department if you are having worsening pain, fever, chills, or new onset of weakness. Please call your primary care doctor for recheck in the next 1 to 3 days.

## 2023-06-02 NOTE — ED Provider Notes (Signed)
[place] EMERGENCY DEPARTMENT AT [facility] Provider Note   CSN: [id number] Arrival date & time: 06/02/23  1149     History  Chief Complaint  Patient presents with   Numbness    Kenneth Wilson is a 47 y.o. adult.  HPI 47 year old patient presents today complaining of chronic paresthesias of the upper extremities, with new pain in the right lower extremity and pain and numbness in the right upper extremity.  Patient describes chronic paresthesias including the pinkies of each arm and the medial aspect of each upper extremity.  Patient has experienced some new numbness and tingling of the right upper extremity from the shoulder down through the thumb that began in the past 7 to 10 days.  There is no weakness.  In the past week there has been some increased pain on the right leg..  There has been no trauma.  New pain has been experienced in the right lower extremity running down the back of the leg and to the small toe.  Patient reports that they have a history of enlarged prostate but denies any history of prostate cancer.  They are followed by Dr. Dion Saucier for the upper extremity issues.  They report a history of MRI of the neck that was normal.    Home Medications Prior to Admission medications   Medication Sig Start Date End Date Taking? Authorizing Provider  bisoprolol (ZEBETA) 10 MG tablet Take by mouth. 05/16/23  Yes [provider]  predniSONE (DELTASONE) 10 MG tablet Take 2 tablets (20 mg total) by mouth daily. 06/02/23  Yes Margarita Grizzle, MD  albuterol (PROVENTIL HFA;VENTOLIN HFA) 108 (90 BASE) MCG/ACT inhaler Inhale 1-2 puffs into the lungs every 6 (six) hours as needed for wheezing or shortness of breath. 02/21/14   Presson, Mathis Fare, PA  amLODipine (NORVASC) 10 MG tablet Take 10 mg by mouth daily.    [provider]  benztropine (COGENTIN) 2 MG tablet Take 2 mg by mouth daily.    [provider]  carbamazepine (TEGRETOL) 100 MG chewable  tablet Chew 200 mg by mouth at bedtime.    [provider]  estradiol (ESTRACE) 2 MG tablet Take 6 mg by mouth daily.    [provider]  furosemide (LASIX) 20 MG tablet Take 20 mg by mouth daily.    [provider]  gabapentin (NEURONTIN) 300 MG capsule Take 1 capsule (300 mg total) by mouth 3 (three) times daily. 02/26/16   Tharon Aquas, PA  hydrochlorothiazide (HYDRODIURIL) 25 MG tablet Take 25 mg by mouth daily.    [provider]  ibuprofen (ADVIL,MOTRIN) 800 MG tablet Take 1 tablet (800 mg total) by mouth 3 (three) times daily. 06/09/14   Piepenbrink, Victorino Dike, PA-C  medroxyPROGESTERone (PROVERA) 5 MG tablet Take 5 mg by mouth daily.    [provider]  meloxicam (MOBIC) 15 MG tablet Take 1 tablet (15 mg total) by mouth daily. 02/11/17   Dorena Bodo, NP  montelukast (SINGULAIR) 10 MG tablet Take 10 mg by mouth at bedtime.    [provider]  naproxen (NAPROSYN) 500 MG tablet Take 1 tablet (500 mg total) by mouth 2 (two) times daily. 07/13/18   Janace Aris, NP  OVER THE COUNTER MEDICATION estrogen    [provider]  oxyCODONE-acetaminophen (PERCOCET/ROXICET) 5-325 MG per tablet Take 1-2 tablets by mouth every 6 (six) hours as needed for severe pain. May take 2 tablets PO q 6 hours for severe pain - Do not  take with Tylenol as this tablet already contains tylenol 06/09/14   Piepenbrink, Jennifer, PA-C  PERCOCET 5-325 MG tablet Take 1 tablet by mouth every 6 (six) hours as needed for severe pain. 02/24/16   Lawyer, Cristal Deer, PA-C  pravastatin (PRAVACHOL) 10 MG tablet Take 10 mg by mouth daily.    [provider]  promethazine (PHENERGAN) 25 MG tablet Take 25 mg by mouth 2 (two) times daily as needed.    [provider]  SERTRALINE HCL PO Take 2 tablets by mouth daily.    [provider]  simvastatin (ZOCOR) 5 MG tablet Take 5 mg by mouth at bedtime.    [provider]  spironolactone  (ALDACTONE) 100 MG tablet Take 100 mg by mouth 2 (two) times daily.    [provider]  tamsulosin (FLOMAX) 0.4 MG CAPS capsule Take 0.4 mg by mouth daily.    [provider]  Tetrahydrozoline HCl (VISINE OP) Place 2 drops into both eyes daily as needed (for dry eyes).    [provider]  tiZANidine (ZANAFLEX) 4 MG tablet Take 1 tablet (4 mg total) by mouth every 6 (six) hours as needed for muscle spasms. 10/06/15   Pisciotta, Joni Reining, PA-C  ziprasidone (GEODON) 80 MG capsule Take 80 mg by mouth 2 (two) times daily with a meal.    [provider]      Allergies    Lisinopril    Review of Systems   Review of Systems  Physical Exam Updated Vital Signs BP 126/83 (BP Location: Right Arm)   Pulse 88   Temp 98 F (36.7 C) (Oral)   Resp 16   Ht 1.854 m (6\' 1" )   Wt 125.6 kg   SpO2 100%   BMI 36.55 kg/m  Physical Exam Vitals reviewed.  Constitutional:      General: She is not in acute distress.    Appearance: Normal appearance. She is obese.  HENT:     Head: Normocephalic.     Right Ear: External ear normal.     Left Ear: External ear normal.     Nose: Nose normal.     Mouth/Throat:     Pharynx: Oropharynx is clear.  Eyes:     Pupils: Pupils are equal, round, and reactive to light.  Cardiovascular:     Rate and Rhythm: Normal rate and regular rhythm.     Pulses: Normal pulses.  Pulmonary:     Effort: Pulmonary effort is normal.  Abdominal:     General: Abdomen is flat.     Palpations: Abdomen is soft.  Musculoskeletal:        General: Normal range of motion.     Cervical back: Normal range of motion.  Skin:    General: Skin is warm.     Capillary Refill: Capillary refill takes less than 2 seconds.  Neurological:     General: No focal deficit present.     Mental Status: She is alert.     Cranial Nerves: No cranial nerve deficit.     Sensory: Sensory deficit present.     Motor: No weakness.     Comments: Some decreased sharp  sensation bilateral upper extremities and rectangular area right anterior chest wall and rle lateral knee to small toe No weakness noted   Psychiatric:        Mood and Affect: Mood normal.        Behavior: Behavior normal.     ED Results / Procedures / Treatments  Labs (all labs ordered are listed, but only abnormal results are displayed) Labs Reviewed  CBC - Abnormal; Notable for the following components:      Result Value   WBC 13.8 (*)    RBC 3.96 (*)    Hemoglobin 12.0 (*)    HCT 36.3 (*)    All other components within normal limits  BASIC METABOLIC PANEL - Abnormal; Notable for the following components:   Glucose, Bld 100 (*)    All other components within normal limits    EKG None  Radiology CT Thoracic Spine Wo Contrast  Result Date: 06/02/2023 CLINICAL DATA:  Chronic right hand and forearm numbness. Recent onset right chest and posterior right leg numbness. EXAM: CT THORACIC AND LUMBAR SPINE WITHOUT CONTRAST TECHNIQUE: Multidetector CT imaging of the thoracic and lumbar spine was performed without contrast. Multiplanar CT image reconstructions were also generated. RADIATION DOSE REDUCTION: This exam was performed according to the departmental dose-optimization program which includes automated exposure control, adjustment of the mA and/or kV according to patient size and/or use of iterative reconstruction technique. COMPARISON:  CT abdomen pelvis dated February 19, 2021. Chest x-Cary Wilford dated October 14, 2012. FINDINGS: CT THORACIC SPINE FINDINGS Alignment: Slight levocurvature of the lower thoracic spine. No listhesis. Vertebrae: No acute fracture or focal pathologic process. Paraspinal and other soft tissues: Negative. Disc levels: Mild multilevel disc height loss and endplate spurring. No high-grade spinal canal stenosis. Mild right neuroforaminal stenosis at T1-T2. CT LUMBAR SPINE FINDINGS Segmentation: 5 lumbar type vertebrae. Alignment: Trace retrolisthesis at L2-L3. Vertebrae:  No acute fracture or focal pathologic process. Paraspinal and other soft tissues: Left-greater-than-right sacroiliac joint periarticular sclerosis with mild chronic erosive changes and scattered subchondral cystic change, consistent with chronic sacroiliitis. 1.4 cm left adrenal nodule with density measuring less than 10 Hounsfield units, consistent with adenoma. Aortoiliac atherosclerotic vascular disease. Disc levels: T12-L1:  Negative. L1-L2:  Negative. L2-L3:  Mild disc bulging and right facet arthropathy.  No stenosis. L3-L4: Mild-to-moderate disc bulging eccentric to the left, encroaching on the exiting left L3 nerve root. Mild bilateral facet arthropathy. Mild spinal canal stenosis. Mild-to-moderate left and mild right neuroforaminal stenosis. L4-L5: Mild disc bulging. Moderate right and mild left facet arthropathy. Mild spinal canal stenosis. No neuroforaminal stenosis. L5-S1: Small shallow broad-based posterior disc protrusion. Moderate right and mild left facet arthropathy. No stenosis. IMPRESSION: Thoracic spine: 1. Mild multilevel thoracic spondylosis. No high-grade stenosis. Lumbar spine: 1. Multilevel lumbar spondylosis as described above. Mild spinal canal stenosis at L3-L4 and L4-L5. Mild-to-moderate left neuroforaminal stenosis at L3-L4. 2. Chronic bilateral sacroiliitis. 3.  Aortic Atherosclerosis (ICD10-I70.0). Electronically Signed   By: Obie Dredge M.D.   On: 06/02/2023 14:12   CT Lumbar Spine Wo Contrast  Result Date: 06/02/2023 CLINICAL DATA:  Chronic right hand and forearm numbness. Recent onset right chest and posterior right leg numbness. EXAM: CT THORACIC AND LUMBAR SPINE WITHOUT CONTRAST TECHNIQUE: Multidetector CT imaging of the thoracic and lumbar spine was performed without contrast. Multiplanar CT image reconstructions were also generated. RADIATION DOSE REDUCTION: This exam was performed according to the departmental dose-optimization program which includes automated exposure  control, adjustment of the mA and/or kV according to patient size and/or use of iterative reconstruction technique. COMPARISON:  CT abdomen pelvis dated February 19, 2021. Chest x-Lora Chavers dated October 14, 2012. FINDINGS: CT THORACIC SPINE FINDINGS Alignment: Slight levocurvature of the lower thoracic spine. No listhesis. Vertebrae: No acute fracture or focal pathologic process. Paraspinal and other soft tissues: Negative. Disc levels: Mild multilevel  disc height loss and endplate spurring. No high-grade spinal canal stenosis. Mild right neuroforaminal stenosis at T1-T2. CT LUMBAR SPINE FINDINGS Segmentation: 5 lumbar type vertebrae. Alignment: Trace retrolisthesis at L2-L3. Vertebrae: No acute fracture or focal pathologic process. Paraspinal and other soft tissues: Left-greater-than-right sacroiliac joint periarticular sclerosis with mild chronic erosive changes and scattered subchondral cystic change, consistent with chronic sacroiliitis. 1.4 cm left adrenal nodule with density measuring less than 10 Hounsfield units, consistent with adenoma. Aortoiliac atherosclerotic vascular disease. Disc levels: T12-L1:  Negative. L1-L2:  Negative. L2-L3:  Mild disc bulging and right facet arthropathy.  No stenosis. L3-L4: Mild-to-moderate disc bulging eccentric to the left, encroaching on the exiting left L3 nerve root. Mild bilateral facet arthropathy. Mild spinal canal stenosis. Mild-to-moderate left and mild right neuroforaminal stenosis. L4-L5: Mild disc bulging. Moderate right and mild left facet arthropathy. Mild spinal canal stenosis. No neuroforaminal stenosis. L5-S1: Small shallow broad-based posterior disc protrusion. Moderate right and mild left facet arthropathy. No stenosis. IMPRESSION: Thoracic spine: 1. Mild multilevel thoracic spondylosis. No high-grade stenosis. Lumbar spine: 1. Multilevel lumbar spondylosis as described above. Mild spinal canal stenosis at L3-L4 and L4-L5. Mild-to-moderate left neuroforaminal  stenosis at L3-L4. 2. Chronic bilateral sacroiliitis. 3.  Aortic Atherosclerosis (ICD10-I70.0). Electronically Signed   By: Obie Dredge M.D.   On: 06/02/2023 14:12   CT Head Wo Contrast  Result Date: 06/02/2023 CLINICAL DATA:  Chronic right hand and forearm numbness. New or numbness in the right chest and posterior right leg. EXAM: CT HEAD WITHOUT CONTRAST CT CERVICAL SPINE WITHOUT CONTRAST TECHNIQUE: Multidetector CT imaging of the head and cervical spine was performed following the standard protocol without intravenous contrast. Multiplanar CT image reconstructions of the cervical spine were also generated. RADIATION DOSE REDUCTION: This exam was performed according to the departmental dose-optimization program which includes automated exposure control, adjustment of the mA and/or kV according to patient size and/or use of iterative reconstruction technique. COMPARISON:  MRI cervical spine dated December 12, 2020. FINDINGS: CT HEAD FINDINGS Brain: No evidence of acute infarction, hemorrhage, hydrocephalus, extra-axial collection or mass lesion/mass effect. Vascular: No hyperdense vessel or unexpected calcification. Skull: Normal. Negative for fracture or focal lesion. Sinuses/Orbits: Frothy secretions in the right frontal sinus. Bilateral anterior ethmoid air cell and left maxillary sinus mucosal thickening without air-fluid levels. The mastoid air cells are clear. The orbits are unremarkable. Other: None. CT CERVICAL SPINE FINDINGS Alignment: Straightening and slight reversal of the normal cervical lordosis. No significant listhesis. Skull base and vertebrae: No acute fracture. No primary bone lesion or focal pathologic process. Soft tissues and spinal canal: No prevertebral fluid or swelling. No visible canal hematoma. Disc levels: Disc bulging, endplate spurring, and uncovertebral hypertrophy from C4-C5 through C7-T1. No high-grade spinal canal or neuroforaminal stenosis. Upper chest: Negative. Other:  None. IMPRESSION: 1. No acute intracranial abnormality. 2. Multilevel cervical spondylosis. No high-grade spinal canal or neuroforaminal stenosis. Electronically Signed   By: Obie Dredge M.D.   On: 06/02/2023 13:53   CT Cervical Spine Wo Contrast  Result Date: 06/02/2023 CLINICAL DATA:  Chronic right hand and forearm numbness. New or numbness in the right chest and posterior right leg. EXAM: CT HEAD WITHOUT CONTRAST CT CERVICAL SPINE WITHOUT CONTRAST TECHNIQUE: Multidetector CT imaging of the head and cervical spine was performed following the standard protocol without intravenous contrast. Multiplanar CT image reconstructions of the cervical spine were also generated. RADIATION DOSE REDUCTION: This exam was performed according to the departmental dose-optimization program which includes automated exposure control, adjustment of  the mA and/or kV according to patient size and/or use of iterative reconstruction technique. COMPARISON:  MRI cervical spine dated December 12, 2020. FINDINGS: CT HEAD FINDINGS Brain: No evidence of acute infarction, hemorrhage, hydrocephalus, extra-axial collection or mass lesion/mass effect. Vascular: No hyperdense vessel or unexpected calcification. Skull: Normal. Negative for fracture or focal lesion. Sinuses/Orbits: Frothy secretions in the right frontal sinus. Bilateral anterior ethmoid air cell and left maxillary sinus mucosal thickening without air-fluid levels. The mastoid air cells are clear. The orbits are unremarkable. Other: None. CT CERVICAL SPINE FINDINGS Alignment: Straightening and slight reversal of the normal cervical lordosis. No significant listhesis. Skull base and vertebrae: No acute fracture. No primary bone lesion or focal pathologic process. Soft tissues and spinal canal: No prevertebral fluid or swelling. No visible canal hematoma. Disc levels: Disc bulging, endplate spurring, and uncovertebral hypertrophy from C4-C5 through C7-T1. No high-grade spinal canal  or neuroforaminal stenosis. Upper chest: Negative. Other: None. IMPRESSION: 1. No acute intracranial abnormality. 2. Multilevel cervical spondylosis. No high-grade spinal canal or neuroforaminal stenosis. Electronically Signed   By: Obie Dredge M.D.   On: 06/02/2023 13:53    Procedures Procedures    Medications Ordered in ED Medications  oxyCODONE-acetaminophen (PERCOCET/ROXICET) 5-325 MG per tablet 2 tablet (has no administration in time range)    ED Course/ Medical Decision Making/ A&P Clinical Course as of 06/02/23 1438  Thu Jun 02, 2023  1404 CT head reviewed interpreted no evidence of acute abnormality cervical spondylosis noted with no high-grade spinal canal or neuroforaminal stenosis noted per radiologist [DR]    Clinical Course User Index [DR] Margarita Grizzle, MD                             Medical Decision Making Amount and/or Complexity of Data Reviewed Labs: ordered. Radiology: ordered.  Risk Prescription drug management.   47 year old patient presents today complaining of multiple paresthesias.  Paresthesias in the upper extremities have been present for some time but appear to be somewhat worse.  There are some worsening paresthesias of the right anterior chest wall and pain of the right lower extremity.  Pulses are intact.  Movement is intact. Imaging obtained of head, neck, thoracic, and lumbar spine.  Acute abnormalities are noted in the head or neck.  Thoracic spine appears normal.  There is some mild disc bulging at L3-L4 L4-L5 and L5 1.  L4-L5 shows moderate right and mild left arthropathy with mild spinal canal stenosis no neural foraminal stenosis L5 L S1 shows no of stenosis.  L3-L4 eccentric bulging more to the left, not correlating with symptoms Plan pain medicine.  Will be started on short course of prednisone. We discussed return precautions and need for follow-up and voices understanding.        Final Clinical Impression(s) / ED Diagnoses Final  diagnoses:  Lumbar radiculopathy  Paresthesia of left upper extremity  Paresthesia of right arm    Rx / DC Orders ED Discharge Orders          Ordered    predniSONE (DELTASONE) 10 MG tablet  Daily        06/02/23 1437              Margarita Grizzle, MD 06/02/23 1438

## 2023-06-02 NOTE — ED Notes (Signed)
Pt verbalized understanding of d/c instructions, meds, and followup care. Denies questions. VSS, no distress noted. Steady gait to exit with all belongings.  ?

## 2023-06-02 NOTE — ED Triage Notes (Signed)
Patient here POV from Home.  Endorses Pain and Numbness to Posterior Right Knee that radiates up and down leg. Also notes Numbness to Right Forearm and Right Chest. States symptoms began years ago specifically to Hand and Arm and other areas have been present for 1 Week  NAD Noted during Triage. A&Ox4. GCS 15. Ambulatory.

## 2023-06-02 NOTE — ED Notes (Signed)
Report given to the next RN... 

## 2023-06-02 NOTE — ED Notes (Signed)
Out to CT 

## 2023-06-07 ENCOUNTER — Ambulatory Visit: Payer: Medicare HMO | Admitting: Physical Therapy

## 2023-06-09 ENCOUNTER — Ambulatory Visit: Payer: Medicare HMO | Admitting: Physical Therapy

## 2023-07-10 ENCOUNTER — Emergency Department (HOSPITAL_BASED_OUTPATIENT_CLINIC_OR_DEPARTMENT_OTHER)
Admission: EM | Admit: 2023-07-10 | Discharge: 2023-07-10 | Disposition: A | Discharge status: Home / Self Care | Payer: Medicare Other | Source: Home / Self Care | Attending: Emergency Medicine | Admitting: Emergency Medicine

## 2023-07-10 ENCOUNTER — Encounter (HOSPITAL_BASED_OUTPATIENT_CLINIC_OR_DEPARTMENT_OTHER): Payer: Self-pay | Admitting: Emergency Medicine

## 2023-07-10 ENCOUNTER — Emergency Department (HOSPITAL_BASED_OUTPATIENT_CLINIC_OR_DEPARTMENT_OTHER): Payer: Medicare Other

## 2023-07-10 ENCOUNTER — Other Ambulatory Visit: Payer: Self-pay

## 2023-07-10 DIAGNOSIS — Z7901 Long term (current) use of anticoagulants: Secondary | ICD-10-CM | POA: Insufficient documentation

## 2023-07-10 DIAGNOSIS — Z79899 Other long term (current) drug therapy: Secondary | ICD-10-CM | POA: Diagnosis not present

## 2023-07-10 DIAGNOSIS — I10 Essential (primary) hypertension: Secondary | ICD-10-CM | POA: Diagnosis not present

## 2023-07-10 DIAGNOSIS — M79604 Pain in right leg: Secondary | ICD-10-CM | POA: Diagnosis present

## 2023-07-10 DIAGNOSIS — I82401 Acute embolism and thrombosis of unspecified deep veins of right lower extremity: Secondary | ICD-10-CM | POA: Diagnosis not present

## 2023-07-10 MED ORDER — APIXABAN (ELIQUIS) VTE STARTER PACK (10MG AND 5MG): ORAL_TABLET | ORAL | 0 refills | Status: DC

## 2023-07-10 MED ORDER — HYDROCODONE-ACETAMINOPHEN 5-325 MG PO TABS
1.0000 | ORAL_TABLET | Freq: Once | ORAL | Status: AC
  Administered 2023-07-10: 1 via ORAL
  Filled 2023-07-10: qty 1

## 2023-07-10 MED ORDER — KETOROLAC TROMETHAMINE 15 MG/ML IJ SOLN
15.0000 mg | Freq: Once | INTRAMUSCULAR | Status: DC
  Filled 2023-07-10: qty 1

## 2023-07-10 MED ORDER — APIXABAN 2.5 MG PO TABS
10.0000 mg | ORAL_TABLET | Freq: Once | ORAL | Status: AC
  Administered 2023-07-10: 10 mg via ORAL
  Filled 2023-07-10: qty 4

## 2023-07-10 NOTE — ED Notes (Signed)
Pt transported to room via wheelchair,assisted to bed, pants /socks and shoes removed.

## 2023-07-10 NOTE — ED Triage Notes (Signed)
A week ago , both her legs were in pain like she had been doing squats. Left leg resolved, but the right leg is still hurting from right shin to right thigh, spasms.

## 2023-07-10 NOTE — Discharge Instructions (Addendum)
Please take your medications as prescribed. Take tylenol/ibuprofen for pain. I recommend close follow-up with DVT clinic for reevaluation and management.  Please do not hesitate to return to emergency department if worrisome signs symptoms we discussed become apparent.

## 2023-07-10 NOTE — ED Provider Notes (Signed)
[place] EMERGENCY DEPARTMENT AT [facility] Provider Note   CSN: [id number] Arrival date & time: 07/10/23  1833     History  No chief complaint on file.   Kenneth Wilson is a 47 y.o. adult history of bipolar 1 disorder, hypertension presents today for evaluation of right leg pain.  Symptoms started 7 days ago.  Patient noted pain and swelling to the right calf.  Denies any recent fall or injury.  No personal or family history of PE/DVT.  Denies any shortness of breath.  Denies any recent travel/surgery.  Not on any anticoagulants.  Patient has a history of chronic paresthesia of the upper extremities with new pain in the lower extremities.  States that this time patient has more pain on the calf with swelling which is different from the previous episodes.  HPI  Past Medical History:  Diagnosis Date   Bipolar 1 disorder (HCC)    Hyperlipemia    Hypertension    Schizoaffective disorder (HCC)    History reviewed. No pertinent surgical history.   Home Medications Prior to Admission medications   Medication Sig Start Date End Date Taking? Authorizing Provider  APIXABAN Everlene Balls) VTE STARTER PACK (10MG  AND 5MG ) Take as directed on package: start with two-5mg  tablets twice daily for 7 days. On day 8, switch to one-5mg  tablet twice daily. 07/10/23  Yes Jeanelle Malling, PA  albuterol (PROVENTIL HFA;VENTOLIN HFA) 108 (90 BASE) MCG/ACT inhaler Inhale 1-2 puffs into the lungs every 6 (six) hours as needed for wheezing or shortness of breath. 02/21/14   Presson, Mathis Fare, PA  amLODipine (NORVASC) 10 MG tablet Take 10 mg by mouth daily.    [provider]  benztropine (COGENTIN) 2 MG tablet Take 2 mg by mouth daily.    [provider]  bisoprolol (ZEBETA) 10 MG tablet Take by mouth. 05/16/23   [provider]  carbamazepine (TEGRETOL) 100 MG chewable tablet Chew 200 mg by mouth at bedtime.    [provider]  estradiol (ESTRACE) 2 MG tablet Take 6 mg by  mouth daily.    [provider]  furosemide (LASIX) 20 MG tablet Take 20 mg by mouth daily.    [provider]  gabapentin (NEURONTIN) 300 MG capsule Take 1 capsule (300 mg total) by mouth 3 (three) times daily. 02/26/16   Tharon Aquas, PA  hydrochlorothiazide (HYDRODIURIL) 25 MG tablet Take 25 mg by mouth daily.    [provider]  ibuprofen (ADVIL,MOTRIN) 800 MG tablet Take 1 tablet (800 mg total) by mouth 3 (three) times daily. 06/09/14   Piepenbrink, Victorino Dike, PA-C  medroxyPROGESTERone (PROVERA) 5 MG tablet Take 5 mg by mouth daily.    [provider]  meloxicam (MOBIC) 15 MG tablet Take 1 tablet (15 mg total) by mouth daily. 02/11/17   Dorena Bodo, NP  montelukast (SINGULAIR) 10 MG tablet Take 10 mg by mouth at bedtime.    [provider]  naproxen (NAPROSYN) 500 MG tablet Take 1 tablet (500 mg total) by mouth 2 (two) times daily. 07/13/18   Janace Aris, NP  OVER THE COUNTER MEDICATION estrogen    [provider]  oxyCODONE-acetaminophen (PERCOCET/ROXICET) 5-325 MG per tablet Take 1-2 tablets by mouth every 6 (six) hours as needed for severe pain. May take 2 tablets PO q 6 hours for severe pain - Do not take with Tylenol as this tablet already contains tylenol 06/09/14   Piepenbrink, Jennifer, PA-C  PERCOCET 5-325 MG tablet Take 1  tablet by mouth every 6 (six) hours as needed for severe pain. 02/24/16   Lawyer, Cristal Deer, PA-C  pravastatin (PRAVACHOL) 10 MG tablet Take 10 mg by mouth daily.    [provider]  predniSONE (DELTASONE) 10 MG tablet Take 2 tablets (20 mg total) by mouth daily. 06/02/23   Margarita Grizzle, MD  promethazine (PHENERGAN) 25 MG tablet Take 25 mg by mouth 2 (two) times daily as needed.    [provider]  SERTRALINE HCL PO Take 2 tablets by mouth daily.    [provider]  simvastatin (ZOCOR) 5 MG tablet Take 5 mg by mouth at bedtime.    [provider]  spironolactone  (ALDACTONE) 100 MG tablet Take 100 mg by mouth 2 (two) times daily.    [provider]  tamsulosin (FLOMAX) 0.4 MG CAPS capsule Take 0.4 mg by mouth daily.    [provider]  Tetrahydrozoline HCl (VISINE OP) Place 2 drops into both eyes daily as needed (for dry eyes).    [provider]  tiZANidine (ZANAFLEX) 4 MG tablet Take 1 tablet (4 mg total) by mouth every 6 (six) hours as needed for muscle spasms. 10/06/15   Pisciotta, Joni Reining, PA-C  ziprasidone (GEODON) 80 MG capsule Take 80 mg by mouth 2 (two) times daily with a meal.    [provider]      Allergies    Lisinopril    Review of Systems   Review of Systems Negative except as per HPI.  Physical Exam Updated Vital Signs BP 111/80   Pulse 73   Temp 98.6 F (37 C) (Oral)   Resp 18   SpO2 100%  Physical Exam Vitals and nursing note reviewed.  Constitutional:      Appearance: Normal appearance.  HENT:     Head: Normocephalic and atraumatic.     Mouth/Throat:     Mouth: Mucous membranes are moist.  Eyes:     General: No scleral icterus. Cardiovascular:     Rate and Rhythm: Normal rate and regular rhythm.     Pulses: Normal pulses.     Heart sounds: Normal heart sounds.  Pulmonary:     Effort: Pulmonary effort is normal.     Breath sounds: Normal breath sounds.  Abdominal:     General: Abdomen is flat.     Palpations: Abdomen is soft.     Tenderness: There is no abdominal tenderness.  Musculoskeletal:        General: No deformity.     Comments: Tenderness to palpation to the right calf, no apparent swelling noticed.  Skin:    General: Skin is warm.     Findings: No rash.  Neurological:     General: No focal deficit present.     Mental Status: She is alert.  Psychiatric:        Mood and Affect: Mood normal.     ED Results / Procedures / Treatments   Labs (all labs ordered are listed, but only abnormal results are displayed) Labs Reviewed - No data to  display  EKG None  Radiology US Venous Img Lower Unilateral Right  Result Date: 07/10/2023 CLINICAL DATA:  Right posterior leg pain and swelling for 1 week EXAM: RIGHT LOWER EXTREMITY VENOUS DOPPLER ULTRASOUND TECHNIQUE: Gray-scale sonography with graded compression, as well as color Doppler and duplex ultrasound were performed to evaluate the lower extremity deep venous systems from the level of the common femoral vein and including the common femoral, femoral, profunda femoral, popliteal  and calf veins including the posterior tibial, peroneal and gastrocnemius veins when visible. The superficial great saphenous vein was also interrogated. Spectral Doppler was utilized to evaluate flow at rest and with distal augmentation maneuvers in the common femoral, femoral and popliteal veins. COMPARISON:  None available FINDINGS: Contralateral Common Femoral Vein: Respiratory phasicity is normal and symmetric with the symptomatic side. No evidence of thrombus. Normal compressibility. Common Femoral Vein: Partially thrombosed. Saphenofemoral Junction: No evidence of thrombus. Normal compressibility and flow on color Doppler imaging. Profunda Femoral Vein: No evidence of thrombus. Normal compressibility and flow on color Doppler imaging. Femoral Vein: Occlusive thrombus. Popliteal Vein: Occlusive thrombus Calf Veins: Occlusive thrombus of the posterior tibial vein. Popliteal vein not visualized. Superficial Great Saphenous Vein: No evidence of thrombus. Normal compressibility. Venous Reflux:  None. Other Findings:  None. IMPRESSION: Extensive acute DVT of the right lower extremity. Electronically Signed   By: Acquanetta Belling M.D.   On: 07/10/2023 20:43    Procedures Procedures    Medications Ordered in ED Medications  HYDROcodone-acetaminophen (NORCO/VICODIN) 5-325 MG per tablet 1 tablet (1 tablet Oral Given 07/10/23 2013)  apixaban (ELIQUIS) tablet 10 mg (10 mg Oral Given 07/10/23 2102)    ED Course/ Medical  Decision Making/ A&P                                 Medical Decision Making Risk Prescription drug management.   This patient presents to the ED for R leg pain, this involves an extensive number of treatment options, and is a complaint that carries with a high risk of complications and morbidity.  The differential diagnosis includes DVT, fracture, dislocation, cellulitis, musculoskeletal pain.  This is not an exhaustive list.  Imaging studies: I ordered imaging studies, personally reviewed, interpreted imaging and agree with the radiologist's interpretations. The results include: Ultrasound showed extensive acute DVT of the right lower extremity.  Problem list/ ED course/ Critical interventions/ Medical management: HPI: See above Vital signs within normal range and stable throughout visit. Laboratory/imaging studies significant for: See above. On physical examination, patient is afebrile and appears in no acute distress.  There was tenderness palpation to the right calf with mild swelling.  Ultrasound showed extensive DVT of the right lower extremity.  She denies any shortness of breath.  Do not think CT angio is needed at this time.  Given first dose of Eliquis 10 mg here.  Ambulatory referral to DVT clinic placed.  Vital signs are normal.  Patient is stable for discharge.  Strict return precaution discussed. I have reviewed the patient home medicines and have made adjustments as needed.  Cardiac monitoring/EKG: The patient was maintained on a cardiac monitor.  I personally reviewed and interpreted the cardiac monitor which showed an underlying rhythm of: sinus rhythm.  Additional history obtained: External records from outside source obtained and reviewed including: Chart review including previous notes, labs, imaging.  Consultations obtained:  Disposition Continued outpatient therapy. Follow-up with PCP recommended for reevaluation of symptoms. Treatment plan discussed with patient.   Pt acknowledged understanding was agreeable to the plan. Worrisome signs and symptoms were discussed with patient, and patient acknowledged understanding to return to the ED if they noticed these signs and symptoms. Patient was stable upon discharge.   This chart was dictated using voice recognition software.  Despite best efforts to proofread,  errors can occur which can change the documentation meaning.  Final Clinical Impression(s) / ED Diagnoses Final diagnoses:  Acute deep vein thrombosis (DVT) of right lower extremity, unspecified vein (HCC)    Rx / DC Orders ED Discharge Orders          Ordered    AMB Referral to Deep Vein Thrombosis Clinic       Comments: Please call [phone] with any questions.   07/10/23 2048    APIXABAN (ELIQUIS) VTE STARTER PACK (10MG  AND 5MG )       Note to Pharmacy: If starter pack unavailable, substitute with seventy-four 5 mg apixaban tabs following the above SIG directions.   07/10/23 2049              Jeanelle Malling, PA 07/10/23 2121    Pollyann Savoy, MD 07/10/23 [phone]

## 2023-07-11 ENCOUNTER — Ambulatory Visit (HOSPITAL_COMMUNITY)
Admission: RE | Admit: 2023-07-11 | Discharge: 2023-07-11 | Disposition: A | Discharge status: Home / Self Care | Payer: Medicare Other | Source: Ambulatory Visit | Attending: Vascular Surgery | Admitting: Vascular Surgery

## 2023-07-11 ENCOUNTER — Other Ambulatory Visit (HOSPITAL_COMMUNITY): Payer: Self-pay

## 2023-07-11 ENCOUNTER — Encounter (HOSPITAL_COMMUNITY): Payer: Self-pay

## 2023-07-11 VITALS — BP 106/77 | HR 67

## 2023-07-11 DIAGNOSIS — I82411 Acute embolism and thrombosis of right femoral vein: Secondary | ICD-10-CM

## 2023-07-11 MED ORDER — APIXABAN 5 MG PO TABS: 5.0000 mg | ORAL_TABLET | Freq: Two times a day (BID) | ORAL | 5 refills | Status: DC

## 2023-07-11 MED ORDER — APIXABAN (ELIQUIS) VTE STARTER PACK (10MG AND 5MG)
ORAL_TABLET | ORAL | 0 refills | Status: DC
  Filled 2023-07-11: qty 74, 30d supply, fill #0

## 2023-07-11 NOTE — Patient Instructions (Signed)
-  Start apixaban (Eliquis) 10 mg twice daily for 7 days followed by 5 mg twice daily. -Your refills have been sent to [facility]. You may need to call the pharmacy to ask them to fill this when you start to run low on your current supply.  -It is important to take your medication around the same time every day.  -Avoid NSAIDs like ibuprofen (Advil, Motrin) and naproxen (Aleve) as well as aspirin doses over 100 mg daily. -Tylenol (acetaminophen) is the preferred over the counter pain medication to lower the risk of bleeding. -Be sure to alert all of your health care providers that you are taking an anticoagulant prior to starting a new medication or having a procedure. -Monitor for signs and symptoms of bleeding (abnormal bruising, prolonged bleeding, nose bleeds, bleeding from gums, discolored urine, black tarry stools). If you have fallen and hit your head OR if your bleeding is severe or not stopping, seek emergency care.  -Go to the emergency room if emergent signs and symptoms of new clot occur (new or worse swelling and pain in an arm or leg, shortness of breath, chest pain, fast or irregular heartbeats, lightheadedness, dizziness, fainting, coughing up blood) or if you experience a significant color change (pale or blue) in the extremity that has the DVT.  -We recommend you wear compression stockings (20-30 mmHg) as long as you are having swelling or pain. Be sure to purchase the correct size and take them off at night.   If you have any questions or need to reschedule an appointment, please call [phone] [facility].  If you are having an emergency, call 911 or present to the nearest emergency room.   What is a DVT?  -Deep vein thrombosis (DVT) is a condition in which a blood clot forms in a vein of the deep venous system which can occur in the lower leg, thigh, pelvis, arm, or neck. This condition is serious and can be life-threatening if the clot travels to the arteries of the lungs and  causing a blockage (pulmonary embolism, PE). A DVT can also damage veins in the leg, which can lead to long-term venous disease, leg pain, swelling, discoloration, and ulcers or sores (post-thrombotic syndrome).  -Treatment may include taking an anticoagulant medication to prevent more clots from forming and the current clot from growing, wearing compression stockings, and/or surgical procedures to remove or dissolve the clot.

## 2023-07-11 NOTE — Progress Notes (Signed)
DVT Clinic Note  Name: Kenneth Wilson     MRN: [id number]     DOB: 10/27/1976     Sex: adult  PCP: Jearld Lesch, MD  Today's Visit: Visit Information: Initial Visit  Referred to DVT Clinic by: Emergency Department - Dr. Bernette Mayers Referred to CPP by: Dr. Randie Heinz Reason for referral:  Chief Complaint  Patient presents with   DVT   HISTORY OF PRESENT ILLNESS: Kenneth Wilson is a 47 y.o. adult with PMH HTN, HLD, bipolar 1 disorder, who presents in a wheelchair and accompanied by her cousin after diagnosis of DVT for medication management. The patient presented to the ED yesterday with worsening right calf pain and swelling. Ultrasound showed extensive RLE DVT. She was given a dose of Eliquis in the ED, prescribed the Eliquis starter pack, and referred to the DVT Clinic for follow up. Due to issues with getting the starter pack filled at her pharmacy, she was scheduled in DVT Clinic today. She reports she was sick at the end of July to the beginning of August where she didn't get out of bed for 4 or 5 days except to use the bathroom. RLE pain started a week and a half ago (mid August). She describes her pain in her right calf and behind the right knee as severe today. She says she read online that estrogen and progesterone can cause DVTs and is concerned because she does not want to stop taking these.   Positive Thrombotic Risk Factors: Bed rest >72 hours within 3 month, Estrogen therapy, Smoking, Obesity Bleeding Risk Factors: None Present  Negative Thrombotic Risk Factors: Previous VTE, Recent surgery (within 3 months), Recent trauma (within 3 months), Recent admission to hospital with acute illness (within 3 months), Paralysis, paresis, or recent plaster cast immobilization of lower extremity, Central venous catheterization, Pregnancy, Sedentary journey lasting >8 hours within 4 weeks, Within 6 weeks postpartum, Recent cesarean section (within 3 months), Testosterone therapy,  Erythropoiesis-stimulating agent, Recent COVID diagnosis (within 3 months), Active cancer, Non-malignant, chronic inflammatory condition, Known thrombophilic condition, Older age  Rx Insurance Coverage: Medicare Extra Help  Rx Affordability: Filled for $0 today using one time free card. Refills will be $11.20 for a 30 or 90 day supply.  Rx Assistance Provided: Free 30-day trial card Preferred Pharmacy: Filled starter pack today during visit at [facility]. Refills sent to [facility].   Past Medical History:  Diagnosis Date   Bipolar 1 disorder (HCC)    Hyperlipemia    Hypertension    Schizoaffective disorder (HCC)     No past surgical history on file.  Social History   Socioeconomic History   Marital status: Single    Spouse name: Not on file   Number of children: Not on file   Years of education: Not on file   Highest education level: Not on file  Occupational History   Not on file  Tobacco Use   Smoking status: Former    Current packs/day: 0.02    Types: Cigarettes   Smokeless tobacco: Never  Substance and Sexual Activity   Alcohol use: Yes    Comment: socially   Drug use: Not Currently   Sexual activity: Never  Other Topics Concern   Not on file  Social History Narrative   [facility]   Caffeine use: 1 cup of coffee daily   Social Determinants of Health   Financial Resource Strain: Medium Risk (02/07/2023)   Overall  Financial Resource Strain (CARDIA)    Difficulty of Paying Living Expenses: Somewhat hard  Food Insecurity: Food Insecurity Present (02/07/2023)   Hunger Vital Sign    Worried About Running Out of Food in the Last Year: Sometimes true    Ran Out of Food in the Last Year: Sometimes true  Transportation Needs: Unmet Transportation Needs (02/07/2023)   PRAPARE - Transportation    Lack of Transportation (Medical): Yes    Lack of Transportation (Non-Medical): Yes  Physical Activity: Inactive (02/07/2023)   Exercise  Vital Sign    Days of Exercise per Week: 0 days    Minutes of Exercise per Session: 0 min  Stress: Stress Concern Present (02/07/2023)   [employer] of Occupational Health - Occupational Stress Questionnaire    Feeling of Stress : Very much  Social Connections: Socially Isolated (02/07/2023)   Social Connection and Isolation Panel [NHANES]    Frequency of Communication with Friends and Family: More than three times a week    Frequency of Social Gatherings with Friends and Family: Never    Attends Religious Services: Never    Database administrator or Organizations: No    Attends Banker Meetings: Never    Marital Status: Never married  Catering manager Violence: Not on file    No family history on file.  Allergies as of 07/11/2023 - Review Complete 07/11/2023  Allergen Reaction Noted   Lisinopril Swelling 06/02/2023    Current Outpatient Medications on File Prior to Encounter  Medication Sig Dispense Refill   amLODipine (NORVASC) 10 MG tablet Take 10 mg by mouth daily.     benztropine (COGENTIN) 2 MG tablet Take 2 mg by mouth daily.     bisoprolol (ZEBETA) 10 MG tablet Take by mouth.     cyclobenzaprine (FLEXERIL) 5 MG tablet Take 5 mg by mouth at bedtime as needed.     estradiol (ESTRACE) 1 MG tablet Take 1 mg by mouth 3 (three) times daily.     gabapentin (NEURONTIN) 600 MG tablet Take 600 mg by mouth 2 (two) times daily.     hydrochlorothiazide (HYDRODIURIL) 25 MG tablet Take 25 mg by mouth daily.     medroxyPROGESTERone (PROVERA) 5 MG tablet Take 5 mg by mouth daily.     montelukast (SINGULAIR) 10 MG tablet Take 10 mg by mouth at bedtime.     oxyCODONE-acetaminophen (PERCOCET) 10-325 MG tablet Take 1 tablet by mouth every 6 (six) hours as needed.     promethazine (PHENERGAN) 25 MG tablet Take 25 mg by mouth 2 (two) times daily as needed.     sertraline (ZOLOFT) 100 MG tablet Take 100 mg by mouth daily.     simvastatin (ZOCOR) 5 MG tablet Take 5 mg by mouth at  bedtime.     spironolactone (ALDACTONE) 100 MG tablet Take 100 mg by mouth 2 (two) times daily.     tamsulosin (FLOMAX) 0.4 MG CAPS capsule Take 0.4 mg by mouth daily.     ziprasidone (GEODON) 80 MG capsule Take 80 mg by mouth 2 (two) times daily with a meal.     No current facility-administered medications on file prior to encounter.   REVIEW OF SYSTEMS:  Review of Systems  Respiratory:  Negative for shortness of breath.   Cardiovascular:  Positive for leg swelling. Negative for chest pain and palpitations.  Musculoskeletal:  Positive for myalgias.  Neurological:  Positive for tingling. Negative for dizziness.  All other systems reviewed and are negative.  PHYSICAL EXAMINATION:  Vitals:   07/11/23 1352  BP: 106/77  Pulse: 67  SpO2: 100%    Physical Exam Vitals reviewed.  Cardiovascular:     Rate and Rhythm: Normal rate.  Pulmonary:     Effort: Pulmonary effort is normal.  Musculoskeletal:        General: Tenderness present.     Right lower leg: Edema present.     Left lower leg: No edema.  Skin:    Findings: No bruising or erythema.  Psychiatric:        Mood and Affect: Mood normal.        Behavior: Behavior normal.        Thought Content: Thought content normal.   Villalta Score for Post-Thrombotic Syndrome: Pain: Severe Cramps: Severe Heaviness: Moderate Paresthesia: Mild Pruritus: Mild Pretibial Edema: Mild Skin Induration: Absent Hyperpigmentation: Absent Redness: Absent Venous Ectasia: Absent Pain on calf compression: Severe Villalta Preliminary Score: 14 Is venous ulcer present?: No If venous ulcer is present and score is <15, then 15 points total are assigned: Absent Villalta Total Score: 14  LABS:  CBC     Component Value Date/Time   WBC 13.8 (H) 06/02/2023 1232   RBC 3.96 (L) 06/02/2023 1232   HGB 12.0 (L) 06/02/2023 1232   HCT 36.3 (L) 06/02/2023 1232   PLT 253 06/02/2023 1232   MCV 91.7 06/02/2023 1232   MCH 30.3 06/02/2023 1232   MCHC  33.1 06/02/2023 1232   RDW 14.4 06/02/2023 1232   LYMPHSABS 4.3 (H) 10/14/2012 1936   MONOABS 1.0 10/14/2012 1936   EOSABS 0.9 (H) 10/14/2012 1936   BASOSABS 0.0 10/14/2012 1936    Hepatic Function      Component Value Date/Time   PROT 7.7 10/14/2012 1936   ALBUMIN 3.7 10/14/2012 1936   AST 31 10/14/2012 1936   ALT 41 10/14/2012 1936   ALKPHOS 103 10/14/2012 1936   BILITOT 0.1 (L) 10/14/2012 1936    Renal Function   Lab Results  Component Value Date   CREATININE 0.75 06/02/2023   CREATININE 0.86 10/14/2012   CREATININE 1.0 12/26/2009    CrCl cannot be calculated (Patient's most recent lab result is older than the maximum 21 days allowed.).   VVS Vascular Lab Studies:  07/10/23 venous US FINDINGS: Contralateral Common Femoral Vein: Respiratory phasicity is normal and symmetric with the symptomatic side. No evidence of thrombus. Normal compressibility.   Common Femoral Vein: Partially thrombosed.   Saphenofemoral Junction: No evidence of thrombus. Normal compressibility and flow on color Doppler imaging.   Profunda Femoral Vein: No evidence of thrombus. Normal compressibility and flow on color Doppler imaging.   Femoral Vein: Occlusive thrombus.   Popliteal Vein: Occlusive thrombus   Calf Veins: Occlusive thrombus of the posterior tibial vein. Popliteal vein not visualized.   Superficial Great Saphenous Vein: No evidence of thrombus. Normal compressibility.   IMPRESSION: Extensive acute DVT of the right lower extremity.  ASSESSMENT: Location of DVT: Right common femoral vein Cause of DVT: provoked by a transient risk factor in the setting of multiple persistent risk factors. The patient reports a 4-5 day period of prolonged immobility in late July/early August where she was sick and only getting out of bed to use the bathroom. Symptoms began a couple weeks later, though review of her 06/02/23 ED visit does mention some RLE pain but she was primarily worked up for  paresthesias. She also vapes, occasionally smokes, and has an elevated BMI. Patient is a transgender woman currently taking estradiol and medroxyprogesterone for  hormone therapy. Discussed with Dr. Randie Heinz who recommends referral to hematology for further recommendations on duration of treatment since these will likely be long-term medications and thus a persistent risk factor for future VTE. Given only partial occlusion of the common femoral vein and the patient's symptoms not extending into the upper thigh, Dr. Randie Heinz recommends anticoagulation alone at this time over mechanical thrombectomy. She was prescribed an Eliquis starter pack from the ED last night but has been unable to fill this at [facility] yet, so this was filled during her visit today with refills sent to her preferred pharmacy. Counseled patient extensively on the medication. No concerns with medication access at this time. All questions have been answered at this time.   PLAN: -Start apixaban (Eliquis) 10 mg twice daily for 7 days followed by 5 mg twice daily. -Expected duration of therapy: per further recommendations from hematology. Therapy started on 07/11/23. -Patient educated on purpose, proper use and potential adverse effects of apixaban (Eliquis). -Discussed importance of taking medication around the same time every day. -Advised patient of medications to avoid (NSAIDs, aspirin doses >100 mg daily). -Educated that Tylenol (acetaminophen) is the preferred analgesic to lower the risk of bleeding. -Advised patient to alert all providers of anticoagulation therapy prior to starting a new medication or having a procedure. -Emphasized importance of monitoring for signs and symptoms of bleeding (abnormal bruising, prolonged bleeding, nose bleeds, bleeding from gums, discolored urine, black tarry stools). -Educated patient to present to the ED if emergent signs and symptoms of new thrombosis occur. -Counseled patient to wear compression stockings  daily, removing at night. Encouraged elevation as well.   Follow up: with PCP and hematology. DVT Clinic as needed.   Pervis Hocking, PharmD, [place], CPP Deep Vein Thrombosis Clinic Clinical Pharmacist Practitioner Office: [phone]

## 2023-07-27 ENCOUNTER — Encounter: Payer: Self-pay | Admitting: Emergency Medicine

## 2023-07-27 ENCOUNTER — Ambulatory Visit
Admission: EM | Admit: 2023-07-27 | Discharge: 2023-07-27 | Disposition: A | Discharge status: Home / Self Care | Payer: Medicare HMO

## 2023-07-27 DIAGNOSIS — I82401 Acute embolism and thrombosis of unspecified deep veins of right lower extremity: Secondary | ICD-10-CM

## 2023-07-27 DIAGNOSIS — M79661 Pain in right lower leg: Secondary | ICD-10-CM | POA: Diagnosis not present

## 2023-07-27 NOTE — ED Triage Notes (Signed)
Pt states she was seen 8/25 and diagnosed with blood clot on right leg. She has been taking eliquis since then and last night noticed increase pain and swelling in right leg again. States she has taken tylenol and percocet with no relief.

## 2023-07-27 NOTE — Discharge Instructions (Addendum)
Advised Patient go to [facility] ED now for further evaluation of right lower leg swelling/pain for imaging and coag studies (PT/INR).

## 2023-07-27 NOTE — ED Provider Notes (Signed)
[facility]    CSN: [id number] Arrival date & time: 07/27/23  1256      History   Chief Complaint Chief Complaint  Patient presents with   Leg Swelling    HPI Kenneth Wilson is a 47 y.o. adult.   HPI 47 year old male presents with right leg swelling with worsening pain.  Patient was evaluated at [facility] ED on 07/10/2023 for DVT and started on apixaban the next day starter pack.  Today patient reports right leg swelling and pain is worsening.  PMH significant for morbid obesity, gender identity disorder., HTN, schizoaffective disorder.  Patient reports taking OTC Tylenol and Percocet with no relief.  Past Medical History:  Diagnosis Date   Bipolar 1 disorder (HCC)    Hyperlipemia    Hypertension    Schizoaffective disorder [facility])     Patient Active Problem List   Diagnosis Date Noted   Acute deep vein thrombosis (DVT) of femoral vein of right lower extremity (HCC) 07/11/2023   PTSD (post-traumatic stress disorder) 03/11/2023   Schizoaffective disorder, bipolar type (HCC) 02/08/2023   Gender identity disorder 08/01/2013   Dyslipidemia 08/01/2013   Schizophrenia (HCC) 08/01/2013   Unspecified essential hypertension 08/01/2013    History reviewed. No pertinent surgical history.     Home Medications    Prior to Admission medications   Medication Sig Start Date End Date Taking? Authorizing Provider  amLODipine (NORVASC) 10 MG tablet Take 10 mg by mouth daily.    [provider]  apixaban (ELIQUIS) 5 MG TABS tablet Take 1 tablet (5 mg total) by mouth 2 (two) times daily. Start taking after completion of starter pack. 07/11/23   Pervis Hocking B, RPH-CPP  APIXABAN Everlene Balls) VTE STARTER PACK (10MG  AND 5MG ) Take as directed on package: start with two-5mg  tablets twice daily for 7 days. On day 8, switch to one-5mg  tablet twice daily. 07/11/23   Pervis Hocking B, RPH-CPP  benztropine (COGENTIN) 2 MG tablet Take 2 mg by mouth daily.    [provider]  bisoprolol (ZEBETA) 10 MG tablet Take by mouth. 05/16/23   [provider]  cyclobenzaprine (FLEXERIL) 5 MG tablet Take 5 mg by mouth at bedtime as needed.    [provider]  estradiol (ESTRACE) 1 MG tablet Take 1 mg by mouth 3 (three) times daily. 06/14/23   [provider]  gabapentin (NEURONTIN) 600 MG tablet Take 600 mg by mouth 2 (two) times daily. 05/17/23   [provider]  hydrochlorothiazide (HYDRODIURIL) 25 MG tablet Take 25 mg by mouth daily.    [provider]  medroxyPROGESTERone (PROVERA) 5 MG tablet Take 5 mg by mouth daily.    [provider]  montelukast (SINGULAIR) 10 MG tablet Take 10 mg by mouth at bedtime.    [provider]  oxyCODONE-acetaminophen (PERCOCET) 10-325 MG tablet Take 1 tablet by mouth every 6 (six) hours as needed.    [provider]  promethazine (PHENERGAN) 25 MG tablet Take 25 mg by mouth 2 (two) times daily as needed.    [provider]  sertraline (ZOLOFT) 100 MG tablet Take 100 mg by mouth daily.    [provider]  simvastatin (ZOCOR) 5 MG tablet Take 5 mg by mouth at bedtime.    [provider]  spironolactone (ALDACTONE) 100 MG tablet Take 100 mg by mouth 2 (two) times daily.    [provider]  tamsulosin (FLOMAX) 0.4 MG CAPS capsule Take 0.4 mg by mouth daily.  [provider]  ziprasidone (GEODON) 80 MG capsule Take 80 mg by mouth 2 (two) times daily with a meal.    [provider]    Family History History reviewed. No pertinent family history.  Social History Social History   Tobacco Use   Smoking status: Former    Current packs/day: 0.02    Types: Cigarettes   Smokeless tobacco: Never  Substance Use Topics   Alcohol use: Yes    Comment: socially   Drug use: Not Currently     Allergies   Lisinopril   Review of Systems Review of Systems  Musculoskeletal:        Right lower leg pain  All other systems  reviewed and are negative.    Physical Exam Triage Vital Signs ED Triage Vitals  Encounter Vitals Group     BP      Systolic BP Percentile      Diastolic BP Percentile      Pulse      Resp      Temp      Temp src      SpO2      Weight      Height      Head Circumference      Peak Flow      Pain Score      Pain Loc      Pain Education      Exclude from Growth Chart    No data found.  Updated Vital Signs BP 116/78 (BP Location: Right Arm)   Pulse 68   Temp 98 F (36.7 C)   SpO2 97%    Physical Exam Vitals and nursing note reviewed.  Constitutional:      Appearance: Normal appearance. She is obese. She is ill-appearing.  HENT:     Head: Normocephalic and atraumatic.     Mouth/Throat:     Mouth: Mucous membranes are moist.     Pharynx: Oropharynx is clear.  Eyes:     Extraocular Movements: Extraocular movements intact.     Conjunctiva/sclera: Conjunctivae normal.     Pupils: Pupils are equal, round, and reactive to light.  Cardiovascular:     Rate and Rhythm: Normal rate and regular rhythm.     Pulses: Normal pulses.     Heart sounds: Normal heart sounds.  Pulmonary:     Effort: Pulmonary effort is normal.     Breath sounds: Normal breath sounds. No wheezing, rhonchi or rales.  Musculoskeletal:        General: Normal range of motion.     Cervical back: Normal range of motion and neck supple.     Comments: Right lower leg with compression stocking on, no deformity noted, unable to palpate PT or DP  Skin:    General: Skin is warm and dry.  Neurological:     General: No focal deficit present.     Mental Status: She is alert and oriented to person, place, and time. Mental status is at baseline.  Psychiatric:        Mood and Affect: Mood normal.        Behavior: Behavior normal.      UC Treatments / Results  Labs (all labs ordered are listed, but only abnormal results are displayed) Labs Reviewed - No data to display  EKG   Radiology No results  found.  Procedures Procedures (including critical care time)  Medications Ordered in UC Medications - No data to display  Initial Impression / Assessment  and Plan / UC Course  I have reviewed the triage vital signs and the nursing notes.  Pertinent labs & imaging results that were available during my care of the patient were reviewed by me and considered in my medical decision making (see chart for details).     MDM: 1.  Acute deep vein thrombosis (DVT) of right lower extremity, unspecified vein-patient currently on apixaban starter pack initially prescribed on 07/10/2023.  Instructed patient to go to [facility] ED now for further evaluation of right lower leg swelling and pain to include imaging and PT/INR; 2.  Pain in right lower leg-concerning for possible another DVT with coag studies performed.  Advised patient to go to [facility] now for further evaluation.  Patient agreed and verbalized understanding of these instructions and this plan of care today.  Patient discharged to ED, hemodynamically stable. Final Clinical Impressions(s) / UC Diagnoses   Final diagnoses:  Acute deep vein thrombosis (DVT) of right lower extremity, unspecified vein (HCC)  Pain in right lower leg     Discharge Instructions      Advised Patient go to [facility] ED now for further evaluation of right lower leg swelling/pain for imaging and coag studies (PT/INR).     ED Prescriptions   None    PDMP not reviewed this encounter.   Trevor Iha, FNP 07/27/23 1348

## 2023-07-28 ENCOUNTER — Inpatient Hospital Stay: Payer: Medicare HMO

## 2023-07-28 ENCOUNTER — Inpatient Hospital Stay: Payer: Medicare HMO | Attending: Hematology and Oncology | Admitting: Hematology and Oncology

## 2023-07-28 ENCOUNTER — Telehealth: Payer: Self-pay

## 2023-07-28 VITALS — BP 141/101 | HR 94 | Temp 98.4°F | Resp 17 | Wt 273.8 lb

## 2023-07-28 DIAGNOSIS — I82411 Acute embolism and thrombosis of right femoral vein: Secondary | ICD-10-CM | POA: Insufficient documentation

## 2023-07-28 DIAGNOSIS — I82431 Acute embolism and thrombosis of right popliteal vein: Secondary | ICD-10-CM | POA: Diagnosis not present

## 2023-07-28 DIAGNOSIS — F17290 Nicotine dependence, other tobacco product, uncomplicated: Secondary | ICD-10-CM | POA: Diagnosis not present

## 2023-07-28 DIAGNOSIS — Z8042 Family history of malignant neoplasm of prostate: Secondary | ICD-10-CM | POA: Insufficient documentation

## 2023-07-28 DIAGNOSIS — Z7901 Long term (current) use of anticoagulants: Secondary | ICD-10-CM | POA: Diagnosis not present

## 2023-07-28 DIAGNOSIS — I82441 Acute embolism and thrombosis of right tibial vein: Secondary | ICD-10-CM | POA: Diagnosis not present

## 2023-07-28 LAB — CBC WITH DIFFERENTIAL (CANCER CENTER ONLY)
Abs Immature Granulocytes: 0.04 10*3/uL (ref 0.00–0.07)
Basophils Absolute: 0.1 10*3/uL (ref 0.0–0.1)
Basophils Relative: 1 %
Eosinophils Absolute: 0.9 10*3/uL — ABNORMAL HIGH (ref 0.0–0.5)
Eosinophils Relative: 8 %
HCT: 37.6 % — ABNORMAL LOW (ref 39.0–52.0)
Hemoglobin: 12.4 g/dL — ABNORMAL LOW (ref 13.0–17.0)
Immature Granulocytes: 0 %
Lymphocytes Relative: 32 %
Lymphs Abs: 3.6 10*3/uL (ref 0.7–4.0)
MCH: 30.1 pg (ref 26.0–34.0)
MCHC: 33 g/dL (ref 30.0–36.0)
MCV: 91.3 fL (ref 80.0–100.0)
Monocytes Absolute: 0.9 10*3/uL (ref 0.1–1.0)
Monocytes Relative: 8 %
Neutro Abs: 5.9 10*3/uL (ref 1.7–7.7)
Neutrophils Relative %: 51 %
Platelet Count: 289 10*3/uL (ref 150–400)
RBC: 4.12 MIL/uL — ABNORMAL LOW (ref 4.22–5.81)
RDW: 14.8 % (ref 11.5–15.5)
WBC Count: 11.4 10*3/uL — ABNORMAL HIGH (ref 4.0–10.5)
nRBC: 0 % (ref 0.0–0.2)

## 2023-07-28 LAB — CMP (CANCER CENTER ONLY)
ALT: 17 U/L (ref 0–44)
AST: 17 U/L (ref 15–41)
Albumin: 3.8 g/dL (ref 3.5–5.0)
Alkaline Phosphatase: 86 U/L (ref 38–126)
Anion gap: 6 (ref 5–15)
BUN: 10 mg/dL (ref 6–20)
CO2: 26 mmol/L (ref 22–32)
Calcium: 9.4 mg/dL (ref 8.9–10.3)
Chloride: 107 mmol/L (ref 98–111)
Creatinine: 0.88 mg/dL (ref 0.61–1.24)
GFR, Estimated: >60 mL/min (ref >60)
Glucose, Bld: 84 mg/dL (ref 70–99)
Potassium: 4.3 mmol/L (ref 3.5–5.1)
Sodium: 139 mmol/L (ref 135–145)
Total Bilirubin: 0.2 mg/dL — ABNORMAL LOW (ref 0.3–1.2)
Total Protein: 7.7 g/dL (ref 6.5–8.1)

## 2023-07-28 MED ORDER — OXYCODONE HCL 5 MG PO TABS: 5.0000 mg | ORAL_TABLET | Freq: Four times a day (QID) | ORAL | 0 refills | Status: DC | PRN

## 2023-07-28 NOTE — Progress Notes (Signed)
[facility] [phone]   [phone]  INITIAL CONSULT NOTE  Patient Care Team: Jearld Lesch, MD as PCP - General (Family Medicine)  Hematological/Oncological History # Right Lower Extremity DVT  07/10/2023: [place] RLE showed an extensive acute DVT in the right lower extremity showing a partially thrombosed common femoral vein, and occluded femoral and popliteal vein as well as occlusive thrombus in the posterior tibial vein.  Started on Eliquis therapy. 07/28/2023: establish care with Dr. Leonides Schanz   CHIEF COMPLAINTS/PURPOSE OF CONSULTATION:  "Right Lower Extremity DVT  "  HISTORY OF PRESENTING ILLNESS:  Kenneth Wilson 47 y.o. adult with medical history significant for HTN, HLD, schizoaffective disorder who presents for evaluation of a RLE DVT.   On review of the previous records Kenneth Wilson "[name]" presented to the emergency department on 07/10/2023 with right lower extremity pain.  A right lower extremity ultrasound was performed which showed an extensive acute DVT in the right lower extremity showing a partially thrombosed common femoral vein, and occluded femoral and popliteal vein as well as occlusive thrombus in the posterior tibial vein.  Started on Eliquis therapy.  Due to concern for these findings the patient was referred to hematology for further evaluation and management.  On exam today Kenneth Wilson reports that she is in a terrible amount of pain.  She reports it is "excruciating".  She reports that she is having a lot of difficulty with the pain control and is taking her home Percocet as well as gabapentin to try to help with the pain.  She only has about 1 Percocet per day and was hoping for a stronger regimen of pain control.  She notes that she is tolerating the Eliquis therapy she was prescribed well with no bleeding, bruising, or dark stools.   On further discussion Ms. Shrout reports that she is a former smoker but does smoke "occasional  cigarettes".  She is currently vaping about 2 puffs/day.  She reports that she is having some social issues with her former husband now moving out although they are attempting to be friends.  She reports that she is also deeply saddened by the passing of her father who was a cancer patient here at the [facility].  He passed away of prostate cancer.  She reports that she has not been on her estrogen therapy for some time but that she is okay with being off the medication as it may have been a provoking factor for her DVT.  She otherwise denies any fevers, chills, sweats, nausea, vomiting or diarrhea.  A full 10 point ROS is otherwise negative.  MEDICAL HISTORY:  Past Medical History:  Diagnosis Date   Bipolar 1 disorder (HCC)    Hyperlipemia    Hypertension    Schizoaffective disorder (HCC)     SURGICAL HISTORY: No past surgical history on file.  SOCIAL HISTORY: Social History   Socioeconomic History   Marital status: Single    Spouse name: Not on file   Number of children: Not on file   Years of education: Not on file   Highest education level: Not on file  Occupational History   Not on file  Tobacco Use   Smoking status: Former    Current packs/day: 0.02    Types: Cigarettes   Smokeless tobacco: Never  Substance and Sexual Activity   Alcohol use: Yes    Comment: socially   Drug use: Not Currently   Sexual activity: Never  Other  Topics Concern   Not on file  Social History Narrative   Regular [facility]: [facility]   Caffeine use: 1 cup of coffee daily   Social Determinants of Health   Financial Resource Strain: Medium Risk (02/07/2023)   Overall Financial Resource Strain (CARDIA)    Difficulty of Paying Living Expenses: Somewhat hard  Food Insecurity: Food Insecurity Present (02/07/2023)   Hunger Vital Sign    Worried About Running Out of Food in the Last Year: Sometimes true    Ran Out of Food in the Last Year: Sometimes true  Transportation  Needs: Unmet Transportation Needs (02/07/2023)   PRAPARE - Transportation    Lack of Transportation (Medical): Yes    Lack of Transportation (Non-Medical): Yes  Physical Activity: Inactive (02/07/2023)   Exercise Vital Sign    Days of Exercise per Week: 0 days    Minutes of Exercise per Session: 0 min  Stress: Stress Concern Present (02/07/2023)   [employer] of Occupational Health - Occupational Stress Questionnaire    Feeling of Stress : Very much  Social Connections: Socially Isolated (02/07/2023)   Social Connection and Isolation Panel [NHANES]    Frequency of Communication with Friends and Family: More than three times a week    Frequency of Social Gatherings with Friends and Family: Never    Attends Religious Services: Never    Database administrator or Organizations: No    Attends Banker Meetings: Never    Marital Status: Never married  Catering manager Violence: Not on file    FAMILY HISTORY: No family history on file.  ALLERGIES:  is allergic to lisinopril.  MEDICATIONS:  Current Outpatient Medications  Medication Sig Dispense Refill   oxyCODONE (OXY IR/ROXICODONE) 5 MG immediate release tablet Take 1-2 tablets (5-10 mg total) by mouth every 6 (six) hours as needed for severe pain. 90 tablet 0   amLODipine (NORVASC) 10 MG tablet Take 10 mg by mouth daily.     apixaban (ELIQUIS) 5 MG TABS tablet Take 1 tablet (5 mg total) by mouth 2 (two) times daily. Start taking after completion of starter pack. 60 tablet 5   APIXABAN (ELIQUIS) VTE STARTER PACK (10MG  AND 5MG ) Take as directed on package: start with two-5mg  tablets twice daily for 7 days. On day 8, switch to one-5mg  tablet twice daily. 74 each 0   benztropine (COGENTIN) 2 MG tablet Take 2 mg by mouth daily.     bisoprolol (ZEBETA) 10 MG tablet Take by mouth.     cyclobenzaprine (FLEXERIL) 5 MG tablet Take 5 mg by mouth at bedtime as needed.     estradiol (ESTRACE) 1 MG tablet Take 1 mg by mouth 3 (three)  times daily.     gabapentin (NEURONTIN) 600 MG tablet Take 600 mg by mouth 2 (two) times daily.     hydrochlorothiazide (HYDRODIURIL) 25 MG tablet Take 25 mg by mouth daily.     medroxyPROGESTERone (PROVERA) 5 MG tablet Take 5 mg by mouth daily.     montelukast (SINGULAIR) 10 MG tablet Take 10 mg by mouth at bedtime.     oxyCODONE-acetaminophen (PERCOCET) 10-325 MG tablet Take 1 tablet by mouth every 6 (six) hours as needed.     promethazine (PHENERGAN) 25 MG tablet Take 25 mg by mouth 2 (two) times daily as needed.     sertraline (ZOLOFT) 100 MG tablet Take 100 mg by mouth daily.     simvastatin (ZOCOR) 5 MG tablet Take 5 mg  by mouth at bedtime.     spironolactone (ALDACTONE) 100 MG tablet Take 100 mg by mouth 2 (two) times daily.     tamsulosin (FLOMAX) 0.4 MG CAPS capsule Take 0.4 mg by mouth daily.     ziprasidone (GEODON) 80 MG capsule Take 80 mg by mouth 2 (two) times daily with a meal.     No current facility-administered medications for this visit.    REVIEW OF SYSTEMS:   Constitutional: ( - ) fevers, ( - )  chills , ( - ) night sweats Eyes: ( - ) blurriness of vision, ( - ) double vision, ( - ) watery eyes Ears, nose, mouth, throat, and face: ( - ) mucositis, ( - ) sore throat Respiratory: ( - ) cough, ( - ) dyspnea, ( - ) wheezes Cardiovascular: ( - ) palpitation, ( - ) chest discomfort, ( - ) lower extremity swelling Gastrointestinal:  ( - ) nausea, ( - ) heartburn, ( - ) change in bowel habits Skin: ( - ) abnormal skin rashes Lymphatics: ( - ) new lymphadenopathy, ( - ) easy bruising Neurological: ( - ) numbness, ( - ) tingling, ( - ) new weaknesses Behavioral/Psych: ( - ) mood change, ( - ) new changes  All other systems were reviewed with the patient and are negative.  PHYSICAL EXAMINATION:  Vitals:   07/28/23 1344  BP: (!) 141/101  Pulse: 94  Resp: 17  Temp: 98.4 F (36.9 C)  SpO2: 100%   Filed Weights   07/28/23 1344  Weight: 273 lb 12.8 oz (124.2 kg)     GENERAL: well appearing middle-aged African-American MTF male in NAD  SKIN: skin color, texture, turgor are normal, no rashes or significant lesions EYES: conjunctiva are pink and non-injected, sclera clear LUNGS: clear to auscultation and percussion with normal breathing effort HEART: regular rate & rhythm and no murmurs and no lower extremity edema Musculoskeletal: no cyanosis of digits and no clubbing  PSYCH: alert & oriented x 3, fluent speech NEURO: no focal motor/sensory deficits  LABORATORY DATA:  I have reviewed the data as listed    Latest Ref Rng & Units 07/28/2023    2:44 PM 06/02/2023   12:32 PM 10/14/2012    7:36 PM  CBC  WBC 4.0 - 10.5 K/uL 11.4  13.8  11.1   Hemoglobin 13.0 - 17.0 g/dL 47.8  29.5  62.1   Hematocrit 39.0 - 52.0 % 37.6  36.3  40.2   Platelets 150 - 400 K/uL 289  253  261        Latest Ref Rng & Units 07/28/2023    2:44 PM 06/02/2023   12:32 PM 10/14/2012    7:36 PM  CMP  Glucose 70 - 99 mg/dL 84  308  657   BUN 6 - 20 mg/dL 10  13  11    Creatinine 0.61 - 1.24 mg/dL 8.46  9.62  9.52   Sodium 135 - 145 mmol/L 139  137  143   Potassium 3.5 - 5.1 mmol/L 4.3  4.5  4.3   Chloride 98 - 111 mmol/L 107  103  106   CO2 22 - 32 mmol/L 26  24  23    Calcium 8.9 - 10.3 mg/dL 9.4  9.6  9.9   Total Protein 6.5 - 8.1 g/dL 7.7   7.7   Total Bilirubin 0.3 - 1.2 mg/dL 0.2   0.1   Alkaline Phos 38 - 126 U/L 86   103   AST  15 - 41 U/L 17   31   ALT 0 - 44 U/L 17   41      ASSESSMENT & PLAN Izekiel J Idler 47 y.o. adult with medical history significant for HTN, HLD, schizoaffective disorder who presents for evaluation of a RLE DVT.   After review of the labs, review of the records, and discussion with the patient the patients findings are most consistent with a provoked right lower extremity DVT.  A provoked venous thromboembolism (VTE) is one that has a clear inciting factor or event. Provoking factors include prolonged travel/immobility, surgery  (particularly abdominal or orthopedic), trauma,  and pregnancy/ estrogen containing birth control. This patient was reported to have been taking estradiol, which would qualify as a transient provoking factor. As such we would recommend 3-6 months of anticoagulation therapy with consideration of additional therapy if symptoms persist. The anticoagulation therapy of choice in this situation is Eliquis therapy.  We discussed that estrogen therapy could continue but due to her extreme pain she would like to hold on her estrogen at this time.. The patient has a supply of this medication and can afford it without difficulty. We will plan to see the patient back in 3 months time to reassess and assure they are doing well on treatment.   #Provoked right lower extremity DVT --findings at this time are consistent with a provoked VTE  --will order baseline CMP and CBC to assure labs are adequate for DOAC therapy  --recommend the patient continue eliquis 5mg  BID  --patient denies any bleeding, bruising, or dark stools on this medication. It is well tolerated. No difficulties accessing/affording the medication  --Patient is currently holding estrogen therapy.  Notes that she will continue to hold it moving forward. --Due to severe pain will prescribe the patient oxycodone 5 to 10 mg p.o. every 6 hours as needed.  She has a prescription for Percocet from her primary care provider but has been unable to reach him. --Due to the severe pain will reach out to vascular surgery to see if any interventions would be available at this time to help reduce clot burden and relieve pain. --RTC in 3 months' time with strict return precautions for overt signs of bleeding.   Orders Placed This Encounter  Procedures   CBC with Differential (Cancer Center Only)    Standing Status:   Future    Number of Occurrences:   1    Standing Expiration Date:   07/27/2024   CMP (Cancer Center only)    Standing Status:   Future    Number of  Occurrences:   1    Standing Expiration Date:   07/27/2024    All questions were answered. The patient knows to call the clinic with any problems, questions or concerns.  A total of more than 60 minutes were spent on this encounter with face-to-face time and non-face-to-face time, including preparing to see the patient, ordering tests and/or medications, counseling the patient and coordination of care as outlined above.   Ulysees Barns, MD Department of Hematology/Oncology [facility] Phone: [phone] Pager: [phone] Email: [email or web address]  07/28/2023 4:21 PM

## 2023-07-28 NOTE — Telephone Encounter (Signed)
Kenneth Wilson, could you please let [name] know I was not able to reach her PCP. I called in oxycodone to her pharmacy to help with her pain. She can take 5-10 mg q 6 H PRN.   Pt advised and voiced understanding.  She had already been informed by her pharmacy

## 2023-07-29 ENCOUNTER — Telehealth: Payer: Self-pay | Admitting: Hematology and Oncology

## 2023-08-03 ENCOUNTER — Other Ambulatory Visit: Payer: Self-pay | Admitting: *Deleted

## 2023-08-03 DIAGNOSIS — I82411 Acute embolism and thrombosis of right femoral vein: Secondary | ICD-10-CM

## 2023-08-11 ENCOUNTER — Ambulatory Visit (HOSPITAL_COMMUNITY): Payer: Medicare HMO

## 2023-08-11 ENCOUNTER — Ambulatory Visit (HOSPITAL_COMMUNITY): Admission: RE | Admit: 2023-08-11 | Payer: Medicare HMO | Source: Ambulatory Visit

## 2023-08-23 ENCOUNTER — Ambulatory Visit (INDEPENDENT_AMBULATORY_CARE_PROVIDER_SITE_OTHER)
Admission: RE | Admit: 2023-08-23 | Discharge: 2023-08-23 | Disposition: A | Discharge status: Home / Self Care | Payer: Medicare HMO | Source: Ambulatory Visit | Attending: Vascular Surgery

## 2023-08-23 ENCOUNTER — Ambulatory Visit (HOSPITAL_COMMUNITY)
Admission: RE | Admit: 2023-08-23 | Discharge: 2023-08-23 | Disposition: A | Discharge status: Home / Self Care | Payer: Medicare HMO | Source: Ambulatory Visit | Attending: Vascular Surgery | Admitting: Vascular Surgery

## 2023-08-23 DIAGNOSIS — I82411 Acute embolism and thrombosis of right femoral vein: Secondary | ICD-10-CM | POA: Diagnosis present

## 2023-08-29 NOTE — Progress Notes (Unsigned)
[name] MRN: [id number] DOB: 01-12-1976 Sex: adult  REASON FOR CONSULT: DVT right leg  HPI: Chirag J Barman is a 47 y.o. adult, with history of hypertension, hyperlipidemia, bipolar that presents for evaluation of right leg DVT.  He was seen in the ED with 7 days of leg pain and had evidence of an extensive right leg DVT on 07/10/2023.  He was started on Eliquis.  He has since had repeat imaging on 08/23/2023 that shows no evidence of iliac vein or IVC thrombus.  He does have residual thrombus in the right femoral vein and popliteal vein including the calf.  Past Medical History:  Diagnosis Date   Bipolar 1 disorder (HCC)    Hyperlipemia    Hypertension    Schizoaffective disorder (HCC)     No past surgical history on file.  No family history on file.  SOCIAL HISTORY: Social History   Socioeconomic History   Marital status: Single    Spouse name: Not on file   Number of children: Not on file   Years of education: Not on file   Highest education level: Not on file  Occupational History   Not on file  Tobacco Use   Smoking status: Former    Current packs/day: 0.02    Types: Cigarettes   Smokeless tobacco: Never  Substance and Sexual Activity   Alcohol use: Yes    Comment: socially   Drug use: Not Currently   Sexual activity: Never  Other Topics Concern   Not on file  Social History Narrative   Regular exercise: [facility]   Caffeine use: 1 cup of coffee daily   Social Determinants of Health   Financial Resource Strain: Medium Risk (02/07/2023)   Overall Financial Resource Strain (CARDIA)    Difficulty of Paying Living Expenses: Somewhat hard  Food Insecurity: Food Insecurity Present (02/07/2023)   Hunger Vital Sign    Worried About Running Out of Food in the Last Year: Sometimes true    Ran Out of Food in the Last Year: Sometimes true  Transportation Needs: Unmet Transportation Needs (02/07/2023)   PRAPARE - Transportation    Lack of  Transportation (Medical): Yes    Lack of Transportation (Non-Medical): Yes  Physical Activity: Inactive (02/07/2023)   Exercise Vital Sign    Days of Exercise per Week: 0 days    Minutes of Exercise per Session: 0 min  Stress: Stress Concern Present (02/07/2023)   [employer] of Occupational Health - Occupational Stress Questionnaire    Feeling of Stress : Very much  Social Connections: Socially Isolated (02/07/2023)   Social Connection and Isolation Panel [NHANES]    Frequency of Communication with Friends and Family: More than three times a week    Frequency of Social Gatherings with Friends and Family: Never    Attends Religious Services: Never    Database administrator or Organizations: No    Attends Engineer, structural: Never    Marital Status: Never married  Catering manager Violence: Not on file    Allergies  Allergen Reactions   Lisinopril Swelling    Current Outpatient Medications  Medication Sig Dispense Refill   amLODipine (NORVASC) 10 MG tablet Take 10 mg by mouth daily.     apixaban (ELIQUIS) 5 MG TABS tablet Take 1 tablet (5 mg total) by mouth 2 (two) times daily. Start taking after completion of starter pack. 60 tablet 5   APIXABAN (ELIQUIS) VTE STARTER  PACK (10MG  AND 5MG ) Take as directed on package: start with two-5mg  tablets twice daily for 7 days. On day 8, switch to one-5mg  tablet twice daily. 74 each 0   benztropine (COGENTIN) 2 MG tablet Take 2 mg by mouth daily.     bisoprolol (ZEBETA) 10 MG tablet Take by mouth.     cyclobenzaprine (FLEXERIL) 5 MG tablet Take 5 mg by mouth at bedtime as needed.     estradiol (ESTRACE) 1 MG tablet Take 1 mg by mouth 3 (three) times daily.     gabapentin (NEURONTIN) 600 MG tablet Take 600 mg by mouth 2 (two) times daily.     hydrochlorothiazide (HYDRODIURIL) 25 MG tablet Take 25 mg by mouth daily.     medroxyPROGESTERone (PROVERA) 5 MG tablet Take 5 mg by mouth daily.     montelukast (SINGULAIR) 10 MG tablet  Take 10 mg by mouth at bedtime.     oxyCODONE (OXY IR/ROXICODONE) 5 MG immediate release tablet Take 1-2 tablets (5-10 mg total) by mouth every 6 (six) hours as needed for severe pain. 90 tablet 0   oxyCODONE-acetaminophen (PERCOCET) 10-325 MG tablet Take 1 tablet by mouth every 6 (six) hours as needed.     promethazine (PHENERGAN) 25 MG tablet Take 25 mg by mouth 2 (two) times daily as needed.     sertraline (ZOLOFT) 100 MG tablet Take 100 mg by mouth daily.     simvastatin (ZOCOR) 5 MG tablet Take 5 mg by mouth at bedtime.     spironolactone (ALDACTONE) 100 MG tablet Take 100 mg by mouth 2 (two) times daily.     tamsulosin (FLOMAX) 0.4 MG CAPS capsule Take 0.4 mg by mouth daily.     ziprasidone (GEODON) 80 MG capsule Take 80 mg by mouth 2 (two) times daily with a meal.     No current facility-administered medications for this visit.    REVIEW OF SYSTEMS:  [X]  denotes positive finding, [ ]  denotes negative finding Cardiac  Comments:  Chest pain or chest pressure: ***   Shortness of breath upon exertion:    Short of breath when lying flat:    Irregular heart rhythm:        Vascular    Pain in calf, thigh, or hip brought on by ambulation:    Pain in feet at night that wakes you up from your sleep:     Blood clot in your veins:    Leg swelling:         Pulmonary    Oxygen at home:    Productive cough:     Wheezing:         Neurologic    Sudden weakness in arms or legs:     Sudden numbness in arms or legs:     Sudden onset of difficulty speaking or slurred speech:    Temporary loss of vision in one eye:     Problems with dizziness:         Gastrointestinal    Blood in stool:     Vomited blood:         Genitourinary    Burning when urinating:     Blood in urine:        Psychiatric    Major depression:         Hematologic    Bleeding problems:    Problems with blood clotting too easily:        Skin    Rashes or ulcers:  Constitutional    Fever or chills:       PHYSICAL EXAM: There were no vitals filed for this visit.  GENERAL: The patient is a well-nourished adult, in no acute distress. The vital signs are documented above. CARDIAC: There is a regular rate and rhythm.  VASCULAR: *** PULMONARY: There is good air exchange bilaterally without wheezing or rales. ABDOMEN: Soft and non-tender with normal pitched bowel sounds.  MUSCULOSKELETAL: There are no major deformities or cyanosis. NEUROLOGIC: No focal weakness or paresthesias are detected. SKIN: There are no ulcers or rashes noted. PSYCHIATRIC: The patient has a normal affect.  DATA:   IVC duplex 08/23/2023 shows no evidence of IVC or iliac vein thrombus.  Right leg DVT study 08/23/2023 shows no evidence of common femoral vein thrombus with partially recanalized thrombus in the femoral vein popliteal vein and tibial veins  Assessment/Plan:  47 y.o. adult, with history of hypertension, hyperlipidemia, bipolar that presents for evaluation of right leg DVT.  He was seen in the ED with 7 days of leg pain and had evidence of an extensive right leg DVT on 07/10/2023.  His repeat imaging on 08/23/2023 shows no evidence of proximal extension in the common femoral vein, iliac vein or IVC.   Cephus Shelling, MD [facility] Office: [phone]

## 2023-08-30 ENCOUNTER — Encounter: Payer: Self-pay | Admitting: Vascular Surgery

## 2023-08-30 ENCOUNTER — Ambulatory Visit (INDEPENDENT_AMBULATORY_CARE_PROVIDER_SITE_OTHER): Payer: Medicare HMO | Admitting: Vascular Surgery

## 2023-08-30 VITALS — BP 129/86 | HR 64 | Temp 97.9°F | Resp 18 | Ht 73.0 in | Wt 280.0 lb

## 2023-08-30 DIAGNOSIS — I82411 Acute embolism and thrombosis of right femoral vein: Secondary | ICD-10-CM

## 2023-09-16 ENCOUNTER — Encounter (HOSPITAL_BASED_OUTPATIENT_CLINIC_OR_DEPARTMENT_OTHER): Payer: Self-pay | Admitting: Emergency Medicine

## 2023-09-16 ENCOUNTER — Emergency Department (HOSPITAL_BASED_OUTPATIENT_CLINIC_OR_DEPARTMENT_OTHER)
Admission: EM | Admit: 2023-09-16 | Discharge: 2023-09-16 | Disposition: A | Discharge status: Home / Self Care | Payer: Medicare HMO | Attending: Emergency Medicine | Admitting: Emergency Medicine

## 2023-09-16 ENCOUNTER — Other Ambulatory Visit: Payer: Self-pay | Admitting: *Deleted

## 2023-09-16 ENCOUNTER — Other Ambulatory Visit (HOSPITAL_BASED_OUTPATIENT_CLINIC_OR_DEPARTMENT_OTHER): Payer: Self-pay

## 2023-09-16 ENCOUNTER — Other Ambulatory Visit: Payer: Self-pay

## 2023-09-16 DIAGNOSIS — Z7901 Long term (current) use of anticoagulants: Secondary | ICD-10-CM | POA: Insufficient documentation

## 2023-09-16 DIAGNOSIS — E669 Obesity, unspecified: Secondary | ICD-10-CM | POA: Insufficient documentation

## 2023-09-16 DIAGNOSIS — R2241 Localized swelling, mass and lump, right lower limb: Secondary | ICD-10-CM | POA: Diagnosis present

## 2023-09-16 DIAGNOSIS — Z6835 Body mass index (BMI) 35.0-35.9, adult: Secondary | ICD-10-CM | POA: Insufficient documentation

## 2023-09-16 DIAGNOSIS — I82411 Acute embolism and thrombosis of right femoral vein: Secondary | ICD-10-CM | POA: Insufficient documentation

## 2023-09-16 MED ORDER — APIXABAN 2.5 MG PO TABS
5.0000 mg | ORAL_TABLET | Freq: Once | ORAL | Status: AC
  Administered 2023-09-16: 5 mg via ORAL
  Filled 2023-09-16: qty 2

## 2023-09-16 MED ORDER — APIXABAN 5 MG PO TABS: 5.0000 mg | ORAL_TABLET | Freq: Two times a day (BID) | ORAL | 5 refills | Status: DC

## 2023-09-16 MED ORDER — APIXABAN 5 MG PO TABS
5.0000 mg | ORAL_TABLET | Freq: Two times a day (BID) | ORAL | 0 refills | Status: DC
  Filled 2023-09-16 (×5): qty 36, 18d supply, fill #0

## 2023-09-16 NOTE — ED Notes (Signed)
Pt given discharge instructions and reviewed prescriptions. Opportunities given for questions. Pt verbalizes understanding. Madi Bonfiglio R, RN 

## 2023-09-16 NOTE — Telephone Encounter (Signed)
Received call from [name]. She states she has moved recently to [place]. In the move, she lost her Eliquis. She tried to get a refill but was going to be charged $600 for this. She has been out of this for 2 days. Advised to see if she can get a week's worth. In the meantime will provide patient assistance forms through [employer] for financial assistance. Also advised that I will leave that form for her to complete along with 2 cards cards from [place] and [place] to see if those discount cards will help her at all.  Pt to pick up forms on Monday after they have been signed by Dr. Maryjean Morn is very appreciative of today's help.

## 2023-09-16 NOTE — ED Triage Notes (Addendum)
Right leg swelling. DVT in August. Has been out of eliquis for 3 days-lost bottle needs refill. -Cp,-SOB. Still has known DVT last evaluated 10/15.

## 2023-09-16 NOTE — ED Provider Notes (Signed)
[place] EMERGENCY DEPARTMENT AT [facility] Provider Note   CSN: [id number] Arrival date & time: 09/16/23  1406     History  Chief Complaint  Patient presents with   Leg Pain    Javonne J Goggins is a 47 y.o. adult.  Patient with known right LE DVT diagnosed in August presents for refill of Eliquis. She reports she moved and lost her current prescription during the move. She has been out for 3 days. No chest pain or SOB. Followed by vascular.   The history is provided by the patient. No language interpreter was used.  Leg Pain      Home Medications Prior to Admission medications   Medication Sig Start Date End Date Taking? Authorizing Provider  apixaban (ELIQUIS) 5 MG TABS tablet Take 1 tablet (5 mg total) by mouth 2 (two) times daily. 09/16/23  Yes Nishat Livingston, Melvenia Beam, PA-C  amLODipine (NORVASC) 10 MG tablet Take 10 mg by mouth daily.    [provider]  apixaban (ELIQUIS) 5 MG TABS tablet Take 1 tablet (5 mg total) by mouth 2 (two) times daily. Start taking after completion of starter pack. 09/16/23   Jaci Standard, MD  benztropine (COGENTIN) 2 MG tablet Take 2 mg by mouth daily.    [provider]  bisoprolol (ZEBETA) 10 MG tablet Take by mouth. 05/16/23   [provider]  cyclobenzaprine (FLEXERIL) 5 MG tablet Take 5 mg by mouth at bedtime as needed.    [provider]  estradiol (ESTRACE) 1 MG tablet Take 1 mg by mouth 3 (three) times daily. Patient not taking: Reported on 08/30/2023 06/14/23   [provider]  gabapentin (NEURONTIN) 600 MG tablet Take 600 mg by mouth 2 (two) times daily. 05/17/23   [provider]  hydrochlorothiazide (HYDRODIURIL) 25 MG tablet Take 25 mg by mouth daily.    [provider]  medroxyPROGESTERone (PROVERA) 5 MG tablet Take 5 mg by mouth daily.    [provider]  montelukast (SINGULAIR) 10 MG tablet Take 10 mg by mouth at bedtime.    [provider]  oxyCODONE  (OXY IR/ROXICODONE) 5 MG immediate release tablet Take 1-2 tablets (5-10 mg total) by mouth every 6 (six) hours as needed for severe pain. 07/28/23   Jaci Standard, MD  oxyCODONE-acetaminophen (PERCOCET) 10-325 MG tablet Take 1 tablet by mouth every 6 (six) hours as needed.    [provider]  promethazine (PHENERGAN) 25 MG tablet Take 25 mg by mouth 2 (two) times daily as needed.    [provider]  sertraline (ZOLOFT) 100 MG tablet Take 100 mg by mouth daily.    [provider]  simvastatin (ZOCOR) 5 MG tablet Take 5 mg by mouth at bedtime.    [provider]  spironolactone (ALDACTONE) 100 MG tablet Take 100 mg by mouth 2 (two) times daily.    [provider]  tamsulosin (FLOMAX) 0.4 MG CAPS capsule Take 0.4 mg by mouth daily.    [provider]  ziprasidone (GEODON) 80 MG capsule Take 80 mg by mouth 2 (two) times daily with a meal.    [provider]      Allergies    Lisinopril    Review of Systems   Review of Systems  Physical Exam Updated Vital Signs BP 121/83 (BP Location: Right Arm)   Pulse 73   Temp (!) 97.4 F (36.3 C)   Resp 18   Wt 122.5 kg  SpO2 100%   BMI 35.62 kg/m  Physical Exam Vitals and nursing note reviewed.  Constitutional:      Appearance: She is obese.  Cardiovascular:     Rate and Rhythm: Normal rate and regular rhythm.     Pulses: Normal pulses.     Heart sounds: No murmur heard. Pulmonary:     Effort: Pulmonary effort is normal.     Breath sounds: Normal breath sounds.  Abdominal:     General: There is no distension.  Musculoskeletal:     Cervical back: Normal range of motion.     Comments:  Bilateral LE edema. No redness.   Skin:    General: Skin is warm and dry.     Findings: No erythema.  Neurological:     General: No focal deficit present.     Mental Status: She is oriented to person, place, and time.     ED Results / Procedures / Treatments   Labs (all labs ordered  are listed, but only abnormal results are displayed) Labs Reviewed - No data to display  EKG None  Radiology No results found.  Procedures Procedures    Medications Ordered in ED Medications  apixaban (ELIQUIS) tablet 5 mg (has no administration in time range)    ED Course/ Medical Decision Making/ A&P Clinical Course as of 09/16/23 1459  Fri Sep 16, 2023  1455 Normal VS - no tachycardia or hypoxia. Doubt PE. Eliquis restarted in the ED and Rx provided for the 18 days she is missing.  [SU]    Clinical Course User Index [SU] Elpidio Anis, PA-C                                 Medical Decision Making Risk Prescription drug management.           Final Clinical Impression(s) / ED Diagnoses Final diagnoses:  Deep vein thrombosis (DVT) of femoral vein of right lower extremity, unspecified chronicity (HCC)    Rx / DC Orders ED Discharge Orders          Ordered    apixaban (ELIQUIS) 5 MG TABS tablet  2 times daily        09/16/23 1458              Elpidio Anis, PA-C 09/16/23 1459    Anders Simmonds T, DO 09/17/23 1236

## 2023-09-16 NOTE — Discharge Instructions (Signed)
Take your Eliquis as prescribed and follow up with vascular as scheduled. Return to the ED with any chest pain or shortness of breath.

## 2023-10-27 ENCOUNTER — Other Ambulatory Visit: Payer: Medicare HMO

## 2023-10-27 ENCOUNTER — Ambulatory Visit: Payer: Medicare HMO | Admitting: Hematology and Oncology

## 2023-10-27 ENCOUNTER — Inpatient Hospital Stay: Payer: Medicare HMO | Attending: Hematology and Oncology

## 2023-10-27 ENCOUNTER — Inpatient Hospital Stay (HOSPITAL_BASED_OUTPATIENT_CLINIC_OR_DEPARTMENT_OTHER): Payer: Medicare HMO | Admitting: Hematology and Oncology

## 2023-10-27 ENCOUNTER — Other Ambulatory Visit: Payer: Self-pay | Admitting: Hematology and Oncology

## 2023-10-27 VITALS — BP 128/91 | HR 71 | Temp 97.1°F | Resp 15 | Wt 277.7 lb

## 2023-10-27 DIAGNOSIS — Z7901 Long term (current) use of anticoagulants: Secondary | ICD-10-CM | POA: Insufficient documentation

## 2023-10-27 DIAGNOSIS — I82411 Acute embolism and thrombosis of right femoral vein: Secondary | ICD-10-CM

## 2023-10-27 DIAGNOSIS — I82441 Acute embolism and thrombosis of right tibial vein: Secondary | ICD-10-CM | POA: Diagnosis not present

## 2023-10-27 DIAGNOSIS — I82431 Acute embolism and thrombosis of right popliteal vein: Secondary | ICD-10-CM | POA: Diagnosis not present

## 2023-10-27 DIAGNOSIS — Z87891 Personal history of nicotine dependence: Secondary | ICD-10-CM | POA: Diagnosis not present

## 2023-10-27 LAB — CMP (CANCER CENTER ONLY)
ALT: 23 U/L (ref 0–44)
AST: 16 U/L (ref 15–41)
Albumin: 4.4 g/dL (ref 3.5–5.0)
Alkaline Phosphatase: 85 U/L (ref 38–126)
Anion gap: 8 (ref 5–15)
BUN: 16 mg/dL (ref 6–20)
CO2: 29 mmol/L (ref 22–32)
Calcium: 10.2 mg/dL (ref 8.9–10.3)
Chloride: 103 mmol/L (ref 98–111)
Creatinine: 1.07 mg/dL (ref 0.61–1.24)
GFR, Estimated: >60 mL/min (ref >60)
Glucose, Bld: 83 mg/dL (ref 70–99)
Potassium: 4.3 mmol/L (ref 3.5–5.1)
Sodium: 140 mmol/L (ref 135–145)
Total Bilirubin: 0.3 mg/dL (ref <1.2)
Total Protein: 8.5 g/dL — ABNORMAL HIGH (ref 6.5–8.1)

## 2023-10-27 LAB — CBC WITH DIFFERENTIAL (CANCER CENTER ONLY)
Abs Immature Granulocytes: 0.04 10*3/uL (ref 0.00–0.07)
Basophils Absolute: 0.1 10*3/uL (ref 0.0–0.1)
Basophils Relative: 1 %
Eosinophils Absolute: 1 10*3/uL — ABNORMAL HIGH (ref 0.0–0.5)
Eosinophils Relative: 9 %
HCT: 42.5 % (ref 39.0–52.0)
Hemoglobin: 14.5 g/dL (ref 13.0–17.0)
Immature Granulocytes: 0 %
Lymphocytes Relative: 33 %
Lymphs Abs: 4 10*3/uL (ref 0.7–4.0)
MCH: 31 pg (ref 26.0–34.0)
MCHC: 34.1 g/dL (ref 30.0–36.0)
MCV: 90.8 fL (ref 80.0–100.0)
Monocytes Absolute: 1 10*3/uL (ref 0.1–1.0)
Monocytes Relative: 8 %
Neutro Abs: 6 10*3/uL (ref 1.7–7.7)
Neutrophils Relative %: 49 %
Platelet Count: 280 10*3/uL (ref 150–400)
RBC: 4.68 MIL/uL (ref 4.22–5.81)
RDW: 14.7 % (ref 11.5–15.5)
WBC Count: 12.2 10*3/uL — ABNORMAL HIGH (ref 4.0–10.5)
nRBC: 0 % (ref 0.0–0.2)

## 2023-10-27 NOTE — Progress Notes (Signed)
[facility] [phone]   [phone]  PROGRESS NOTE  Patient Care Team: Jearld Lesch, MD as PCP - General (Family Medicine)  Hematological/Oncological History # Right Lower Extremity DVT  07/10/2023: [place] RLE showed an extensive acute DVT in the right lower extremity showing a partially thrombosed common femoral vein, and occluded femoral and popliteal vein as well as occlusive thrombus in the posterior tibial vein.  Started on Eliquis therapy. 07/28/2023: establish care with Dr. Leonides Schanz   Interval History:  Kenneth Wilson 47 y.o. adult with medical history significant for RLE DVT who presents for a follow up visit. The patient's last visit was on 07/28/2023. In the interim since the last visit she has met with vascular surgery who has no surgical options to help with the lower extremity discomfort.  On exam today [name] reports that she has been tolerating Eliquis therapy well with no bleeding, bruising, or dark stools.  She reports that she is not having any blood in the stool or blood in the urine.  She does have some occasional blood when she brushes her teeth.  She reports that she did restart her estrogen therapy at 1 mg once daily.  She reports that she does still have some discomfort in the right leg but that she has "good days and bad days".  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  She is also sitting on a hard stool when this occurs.  She reports she has had no missed dosages and has been taking her home Percocet to try to help with this.  She otherwise denies any fevers, chills, sweats, nausea, vomiting or diarrhea.  A full 10 point ROS is otherwise negative.  MEDICAL HISTORY:  Past Medical History:  Diagnosis Date   Bipolar 1 disorder (HCC)    Hyperlipemia    Hypertension    Schizoaffective disorder (HCC)     SURGICAL HISTORY: No  past surgical history on file.  SOCIAL HISTORY: Social History   Socioeconomic History   Marital status: Single    Spouse name: Not on file   Number of children: Not on file   Years of education: Not on file   Highest education level: Not on file  Occupational History   Not on file  Tobacco Use   Smoking status: Former    Current packs/day: 0.02    Types: Cigarettes   Smokeless tobacco: Never  Substance and Sexual Activity   Alcohol use: Yes    Comment: socially   Drug use: Not Currently   Sexual activity: Never  Other Topics Concern   Not on file  Social History Narrative   [facility]: [facility]   Caffeine use: 1 cup of coffee daily   Social Drivers of Health   Financial Resource Strain: Medium Risk (02/07/2023)   Overall Financial Resource Strain (CARDIA)    Difficulty of Paying Living Expenses: Somewhat hard  Food Insecurity: Food Insecurity Present (02/07/2023)   Hunger Vital Sign    Worried About Running Out of Food in the Last Year: Sometimes true    Ran Out of Food in the Last Year: Sometimes true  Transportation Needs: Unmet Transportation Needs (02/07/2023)   PRAPARE - Transportation    Lack of Transportation (Medical): Yes    Lack of Transportation (Non-Medical): Yes  Physical Activity: Inactive (02/07/2023)   Exercise Vital  Sign    Days of Exercise per Week: 0 days    Minutes of Exercise per Session: 0 min  Stress: Stress Concern Present (02/07/2023)   [employer] of Occupational Health - Occupational Stress Questionnaire    Feeling of Stress : Very much  Social Connections: Socially Isolated (02/07/2023)   Social Connection and Isolation Panel [NHANES]    Frequency of Communication with Friends and Family: More than three times a week    Frequency of Social Gatherings with Friends and Family: Never    Attends Religious Services: Never    Database administrator or Organizations: No    Attends Banker Meetings: Never     Marital Status: Never married  Catering manager Violence: Not on file    FAMILY HISTORY: No family history on file.  ALLERGIES:  is allergic to lisinopril.  MEDICATIONS:  Current Outpatient Medications  Medication Sig Dispense Refill   estradiol (ESTRACE) 1 MG tablet Take 1 mg by mouth 3 (three) times daily.     venlafaxine (EFFEXOR) 75 MG tablet Take 75 mg by mouth daily.     amLODipine (NORVASC) 10 MG tablet Take 10 mg by mouth daily.     apixaban (ELIQUIS) 5 MG TABS tablet Take 1 tablet (5 mg total) by mouth 2 (two) times daily. Start taking after completion of starter pack. 60 tablet 5   benztropine (COGENTIN) 2 MG tablet Take 2 mg by mouth daily.     bisoprolol (ZEBETA) 10 MG tablet Take by mouth.     cyclobenzaprine (FLEXERIL) 5 MG tablet Take 5 mg by mouth at bedtime as needed.     gabapentin (NEURONTIN) 600 MG tablet Take 600 mg by mouth 2 (two) times daily.     hydrochlorothiazide (HYDRODIURIL) 25 MG tablet Take 25 mg by mouth daily.     medroxyPROGESTERone (PROVERA) 5 MG tablet Take 5 mg by mouth daily.     montelukast (SINGULAIR) 10 MG tablet Take 10 mg by mouth at bedtime.     oxyCODONE-acetaminophen (PERCOCET) 10-325 MG tablet Take 1 tablet by mouth every 6 (six) hours as needed.     promethazine (PHENERGAN) 25 MG tablet Take 25 mg by mouth 2 (two) times daily as needed.     simvastatin (ZOCOR) 5 MG tablet Take 5 mg by mouth at bedtime.     spironolactone (ALDACTONE) 100 MG tablet Take 100 mg by mouth 2 (two) times daily.     tamsulosin (FLOMAX) 0.4 MG CAPS capsule Take 0.4 mg by mouth daily.     ziprasidone (GEODON) 80 MG capsule Take 80 mg by mouth 2 (two) times daily with a meal.     No current facility-administered medications for this visit.    REVIEW OF SYSTEMS:   Constitutional: ( - ) fevers, ( - )  chills , ( - ) night sweats Eyes: ( - ) blurriness of vision, ( - ) double vision, ( - ) watery eyes Ears, nose, mouth, throat, and face: ( - ) mucositis, ( - )  sore throat Respiratory: ( - ) cough, ( - ) dyspnea, ( - ) wheezes Cardiovascular: ( - ) palpitation, ( - ) chest discomfort, ( - ) lower extremity swelling Gastrointestinal:  ( - ) nausea, ( - ) heartburn, ( - ) change in bowel habits Skin: ( - ) abnormal skin rashes Lymphatics: ( - ) new lymphadenopathy, ( - ) easy bruising Neurological: ( - ) numbness, ( - ) tingling, ( - ) new weaknesses  Behavioral/Psych: ( - ) mood change, ( - ) new changes  All other systems were reviewed with the patient and are negative.  PHYSICAL EXAMINATION: Vitals:   10/27/23 1410  BP: (!) 128/91  Pulse: 71  Resp: 15  Temp: (!) 97.1 F (36.2 C)  SpO2: 96%   Filed Weights   10/27/23 1410  Weight: 277 lb 11.2 oz (126 kg)    GENERAL: Well-appearing African-American male, alert, no distress and comfortable SKIN: skin color, texture, turgor are normal, no rashes or significant lesions EYES: conjunctiva are pink and non-injected, sclera clear LUNGS: clear to auscultation and percussion with normal breathing effort HEART: regular rate & rhythm and no murmurs and no lower extremity edema Musculoskeletal: no cyanosis of digits and no clubbing  PSYCH: alert & oriented x 3, fluent speech NEURO: no focal motor/sensory deficits  LABORATORY DATA:  I have reviewed the data as listed    Latest Ref Rng & Units 10/27/2023    1:36 PM 07/28/2023    2:44 PM 06/02/2023   12:32 PM  CBC  WBC 4.0 - 10.5 K/uL 12.2  11.4  13.8   Hemoglobin 13.0 - 17.0 g/dL 21.3  08.6  57.8   Hematocrit 39.0 - 52.0 % 42.5  37.6  36.3   Platelets 150 - 400 K/uL 280  289  253        Latest Ref Rng & Units 10/27/2023    1:36 PM 07/28/2023    2:44 PM 06/02/2023   12:32 PM  CMP  Glucose 70 - 99 mg/dL 83  84  469   BUN 6 - 20 mg/dL 16  10  13    Creatinine 0.61 - 1.24 mg/dL 6.29  5.28  4.13   Sodium 135 - 145 mmol/L 140  139  137   Potassium 3.5 - 5.1 mmol/L 4.3  4.3  4.5   Chloride 98 - 111 mmol/L 103  107  103   CO2 22 - 32 mmol/L  29  26  24    Calcium 8.9 - 10.3 mg/dL 24.4  9.4  9.6   Total Protein 6.5 - 8.1 g/dL 8.5  7.7    Total Bilirubin <1.2 mg/dL 0.3  0.2    Alkaline Phos 38 - 126 U/L 85  86    AST 15 - 41 U/L 16  17    ALT 0 - 44 U/L 23  17     RADIOGRAPHIC STUDIES: No results found.  ASSESSMENT & PLAN Sabastian J Medley 47 y.o. adult with medical history significant for RLE DVT who presents for a follow up visit.   After review of the labs, review of the records, and discussion with the patient the patients findings are most consistent with a provoked right lower extremity DVT.   A provoked venous thromboembolism (VTE) is one that has a clear inciting factor or event. Provoking factors include prolonged travel/immobility, surgery (particularly abdominal or orthopedic), trauma,  and pregnancy/ estrogen containing birth control. This patient was reported to have been taking estradiol, which would qualify as a transient provoking factor. As such we would recommend 3-6 months of anticoagulation therapy with consideration of additional therapy if symptoms persist. The anticoagulation therapy of choice in this situation is Eliquis therapy.  We discussed that estrogen therapy could continue but due to her extreme pain she would like to hold on her estrogen at this time.. The patient has a supply of this medication and can afford it without difficulty. We will plan to see the patient  back in 3 months time to reassess and assure they are doing well on treatment.    #Provoked right lower extremity DVT --findings at this time are consistent with a provoked VTE  --recommend the patient continue eliquis 5mg  BID for 6 months total duration. End of therapy in Feb 2025.  --patient denies any bleeding, bruising, or dark stools on this medication. It is well tolerated. No difficulties accessing/affording the medication  --Patient restarted estrogen therapy.  Discussed with patient that as long as she is taking that she will need to  continue her anticoagulation therapy as this likely provoked her VTE. --Labs today show white blood cell count 12.2, hemoglobin 14.5, MCV 90.8, platelets 280 --RTC in 2 months' time with strict return precautions for overt signs of bleeding.  At that time can discuss maintenance dosing versus discontinuation depending on what the patient decides to do with her estrogen therapy.  No orders of the defined types were placed in this encounter.   All questions were answered. The patient knows to call the clinic with any problems, questions or concerns.  A total of more than 30 minutes were spent on this encounter with face-to-face time and non-face-to-face time, including preparing to see the patient, ordering tests and/or medications, counseling the patient and coordination of care as outlined above.   Ulysees Barns, MD Department of Hematology/Oncology [facility] at [facility] Phone: [phone] Pager: [phone] Email: [email or web address]  11/06/2023 5:16 PM

## 2023-12-26 ENCOUNTER — Other Ambulatory Visit: Payer: Self-pay | Admitting: *Deleted

## 2023-12-26 ENCOUNTER — Other Ambulatory Visit: Payer: Self-pay | Admitting: Student-PharmD

## 2023-12-26 MED ORDER — APIXABAN 5 MG PO TABS: 5.0000 mg | ORAL_TABLET | Freq: Two times a day (BID) | ORAL | 5 refills | Status: DC

## 2023-12-28 ENCOUNTER — Other Ambulatory Visit: Payer: Self-pay | Admitting: Hematology and Oncology

## 2023-12-28 ENCOUNTER — Inpatient Hospital Stay: Payer: Medicare HMO | Attending: Hematology and Oncology

## 2023-12-28 ENCOUNTER — Inpatient Hospital Stay (HOSPITAL_BASED_OUTPATIENT_CLINIC_OR_DEPARTMENT_OTHER): Payer: Medicare HMO | Admitting: Hematology and Oncology

## 2023-12-28 VITALS — BP 113/78 | HR 60 | Temp 97.4°F | Resp 14 | Wt 279.1 lb

## 2023-12-28 DIAGNOSIS — Z7901 Long term (current) use of anticoagulants: Secondary | ICD-10-CM | POA: Insufficient documentation

## 2023-12-28 DIAGNOSIS — I82411 Acute embolism and thrombosis of right femoral vein: Secondary | ICD-10-CM | POA: Diagnosis present

## 2023-12-28 DIAGNOSIS — I82431 Acute embolism and thrombosis of right popliteal vein: Secondary | ICD-10-CM | POA: Diagnosis not present

## 2023-12-28 DIAGNOSIS — Z87891 Personal history of nicotine dependence: Secondary | ICD-10-CM | POA: Diagnosis not present

## 2023-12-28 DIAGNOSIS — I82441 Acute embolism and thrombosis of right tibial vein: Secondary | ICD-10-CM | POA: Diagnosis not present

## 2023-12-28 LAB — CBC WITH DIFFERENTIAL (CANCER CENTER ONLY)
Abs Immature Granulocytes: 0.03 10*3/uL (ref 0.00–0.07)
Basophils Absolute: 0.1 10*3/uL (ref 0.0–0.1)
Basophils Relative: 1 %
Eosinophils Absolute: 1 10*3/uL — ABNORMAL HIGH (ref 0.0–0.5)
Eosinophils Relative: 9 %
HCT: 38.8 % — ABNORMAL LOW (ref 39.0–52.0)
Hemoglobin: 13 g/dL (ref 13.0–17.0)
Immature Granulocytes: 0 %
Lymphocytes Relative: 39 %
Lymphs Abs: 4 10*3/uL (ref 0.7–4.0)
MCH: 30 pg (ref 26.0–34.0)
MCHC: 33.5 g/dL (ref 30.0–36.0)
MCV: 89.6 fL (ref 80.0–100.0)
Monocytes Absolute: 0.9 10*3/uL (ref 0.1–1.0)
Monocytes Relative: 9 %
Neutro Abs: 4.4 10*3/uL (ref 1.7–7.7)
Neutrophils Relative %: 42 %
Platelet Count: 235 10*3/uL (ref 150–400)
RBC: 4.33 MIL/uL (ref 4.22–5.81)
RDW: 14.6 % (ref 11.5–15.5)
WBC Count: 10.4 10*3/uL (ref 4.0–10.5)
nRBC: 0 % (ref 0.0–0.2)

## 2023-12-28 LAB — CMP (CANCER CENTER ONLY)
ALT: 22 U/L (ref 0–44)
AST: 15 U/L (ref 15–41)
Albumin: 4.2 g/dL (ref 3.5–5.0)
Alkaline Phosphatase: 73 U/L (ref 38–126)
Anion gap: 6 (ref 5–15)
BUN: 21 mg/dL — ABNORMAL HIGH (ref 6–20)
CO2: 24 mmol/L (ref 22–32)
Calcium: 9.5 mg/dL (ref 8.9–10.3)
Chloride: 107 mmol/L (ref 98–111)
Creatinine: 1.28 mg/dL — ABNORMAL HIGH (ref 0.61–1.24)
GFR, Estimated: >60 mL/min (ref >60)
Glucose, Bld: 76 mg/dL (ref 70–99)
Potassium: 4.3 mmol/L (ref 3.5–5.1)
Sodium: 137 mmol/L (ref 135–145)
Total Bilirubin: 0.3 mg/dL (ref 0.0–1.2)
Total Protein: 7.9 g/dL (ref 6.5–8.1)

## 2023-12-28 NOTE — Progress Notes (Signed)
[facility] [phone]   [phone]  PROGRESS NOTE  Patient Care Team: Jearld Lesch, MD as PCP - General (Family Medicine)  Hematological/Oncological History # Right Lower Extremity DVT  07/10/2023: [place] RLE showed an extensive acute DVT in the right lower extremity showing a partially thrombosed common femoral vein, and occluded femoral and popliteal vein as well as occlusive thrombus in the posterior tibial vein.  Started on Eliquis therapy. 07/28/2023: establish care with Dr. Leonides Schanz   Interval History:  Kenneth Wilson 48 y.o. adult with medical history significant for RLE DVT who presents for a follow up visit. The patient's last visit was on 10/27/2023. In the interim since the last visit she has continued on eliquis therapy.  On exam today Kenneth Wilson reports she has been faithfully taking her Eliquis therapy 5 mg twice daily and interim since her last visit.  She reports that she has not been having any issues with bleeding, dark stools.  She reports that she is having some occasional bruising mostly on her arms.  She reports that she does feel some soreness on her right inner thigh.  She notes that there is occasional throbbing when she sits, but overall there is no swelling or pain.  She reports she has had no falls in the medication she is taking because $0 per month.  She does continue to take her estrogen 1 mg/day.  She reports that she is planning to have 3 teeth removed notes she will make her dentist aware she is taking Eliquis so we can make recommendations regarding holding this therapy.  Overall she notes that she is willing and able to continue Eliquis therapy as long as she is on estrogen.  She otherwise denies any fevers, chills, sweats, nausea, vomiting or diarrhea.  A full 10 point ROS is otherwise negative.  MEDICAL HISTORY:  Past Medical History:  Diagnosis Date   Bipolar 1 disorder (HCC)    Hyperlipemia    Hypertension     Schizoaffective disorder (HCC)     SURGICAL HISTORY: No past surgical history on file.  SOCIAL HISTORY: Social History   Socioeconomic History   Marital status: Single    Spouse name: Not on file   Number of children: Not on file   Years of education: Not on file   Highest education level: Not on file  Occupational History   Not on file  Tobacco Use   Smoking status: Former    Current packs/day: 0.02    Types: Cigarettes   Smokeless tobacco: Never  Substance and Sexual Activity   Alcohol use: Yes    Comment: socially   Drug use: Not Currently   Sexual activity: Never  Other Topics Concern   Not on file  Social History Narrative   Regular [facility]: [facility]   Caffeine use: 1 cup of coffee daily   Social Drivers of Health   Financial Resource Strain: Medium Risk (02/07/2023)   Overall Financial Resource Strain (CARDIA)    Difficulty of Paying Living Expenses: Somewhat hard  Food Insecurity: Food Insecurity Present (02/07/2023)   Hunger Vital Sign    Worried About Running Out of Food in the Last Year: Sometimes true    Ran Out of Food in the Last Year: Sometimes true  Transportation Needs: Unmet Transportation Needs (02/07/2023)   PRAPARE - Transportation    Lack of Transportation (Medical): Yes    Lack of Transportation (Non-Medical): Yes  Physical Activity: Inactive (02/07/2023)  Exercise Vital Sign    Days of Exercise per Week: 0 days    Minutes of Exercise per Session: 0 min  Stress: Stress Concern Present (02/07/2023)   [employer] of Occupational Health - Occupational Stress Questionnaire    Feeling of Stress : Very much  Social Connections: Socially Isolated (02/07/2023)   Social Connection and Isolation Panel [NHANES]    Frequency of Communication with Friends and Family: More than three times a week    Frequency of Social Gatherings with Friends and Family: Never    Attends Religious Services: Never    Database administrator or  Organizations: No    Attends Banker Meetings: Never    Marital Status: Never married  Catering manager Violence: Not on file    FAMILY HISTORY: No family history on file.  ALLERGIES:  is allergic to lisinopril.  MEDICATIONS:  Current Outpatient Medications  Medication Sig Dispense Refill   amLODipine (NORVASC) 10 MG tablet Take 10 mg by mouth daily.     apixaban (ELIQUIS) 5 MG TABS tablet Take 1 tablet (5 mg total) by mouth 2 (two) times daily. 60 tablet 5   benztropine (COGENTIN) 2 MG tablet Take 2 mg by mouth daily.     bisoprolol (ZEBETA) 10 MG tablet Take by mouth.     cyclobenzaprine (FLEXERIL) 5 MG tablet Take 5 mg by mouth at bedtime as needed.     estradiol (ESTRACE) 1 MG tablet Take 1 mg by mouth 3 (three) times daily.     gabapentin (NEURONTIN) 600 MG tablet Take 600 mg by mouth 2 (two) times daily.     hydrochlorothiazide (HYDRODIURIL) 25 MG tablet Take 25 mg by mouth daily.     medroxyPROGESTERone (PROVERA) 5 MG tablet Take 5 mg by mouth daily.     montelukast (SINGULAIR) 10 MG tablet Take 10 mg by mouth at bedtime.     oxyCODONE-acetaminophen (PERCOCET) 10-325 MG tablet Take 1 tablet by mouth every 6 (six) hours as needed.     promethazine (PHENERGAN) 25 MG tablet Take 25 mg by mouth 2 (two) times daily as needed.     simvastatin (ZOCOR) 5 MG tablet Take 5 mg by mouth at bedtime.     spironolactone (ALDACTONE) 100 MG tablet Take 100 mg by mouth 2 (two) times daily.     tamsulosin (FLOMAX) 0.4 MG CAPS capsule Take 0.4 mg by mouth daily.     venlafaxine (EFFEXOR) 75 MG tablet Take 75 mg by mouth daily.     ziprasidone (GEODON) 80 MG capsule Take 80 mg by mouth 2 (two) times daily with a meal.     No current facility-administered medications for this visit.    REVIEW OF SYSTEMS:   Constitutional: ( - ) fevers, ( - )  chills , ( - ) night sweats Eyes: ( - ) blurriness of vision, ( - ) double vision, ( - ) watery eyes Ears, nose, mouth, throat, and face: (  - ) mucositis, ( - ) sore throat Respiratory: ( - ) cough, ( - ) dyspnea, ( - ) wheezes Cardiovascular: ( - ) palpitation, ( - ) chest discomfort, ( - ) lower extremity swelling Gastrointestinal:  ( - ) nausea, ( - ) heartburn, ( - ) change in bowel habits Skin: ( - ) abnormal skin rashes Lymphatics: ( - ) new lymphadenopathy, ( - ) easy bruising Neurological: ( - ) numbness, ( - ) tingling, ( - ) new weaknesses Behavioral/Psych: ( - ) mood  change, ( - ) new changes  All other systems were reviewed with the patient and are negative.  PHYSICAL EXAMINATION: Vitals:   12/28/23 1522  BP: 113/78  Pulse: 60  Resp: 14  Temp: (!) 97.4 F (36.3 C)  SpO2: 95%    Filed Weights   12/28/23 1522  Weight: 279 lb 1.6 oz (126.6 kg)     GENERAL: Well-appearing African-American male, alert, no distress and comfortable SKIN: skin color, texture, turgor are normal, no rashes or significant lesions EYES: conjunctiva are pink and non-injected, sclera clear LUNGS: clear to auscultation and percussion with normal breathing effort HEART: regular rate & rhythm and no murmurs and no lower extremity edema Musculoskeletal: no cyanosis of digits and no clubbing  PSYCH: alert & oriented x 3, fluent speech NEURO: no focal motor/sensory deficits  LABORATORY DATA:  I have reviewed the data as listed    Latest Ref Rng & Units 12/28/2023    2:28 PM 10/27/2023    1:36 PM 07/28/2023    2:44 PM  CBC  WBC 4.0 - 10.5 K/uL 10.4  12.2  11.4   Hemoglobin 13.0 - 17.0 g/dL 16.1  09.6  04.5   Hematocrit 39.0 - 52.0 % 38.8  42.5  37.6   Platelets 150 - 400 K/uL 235  280  289        Latest Ref Rng & Units 12/28/2023    2:28 PM 10/27/2023    1:36 PM 07/28/2023    2:44 PM  CMP  Glucose 70 - 99 mg/dL 76  83  84   BUN 6 - 20 mg/dL 21  16  10    Creatinine 0.61 - 1.24 mg/dL 4.09  8.11  9.14   Sodium 135 - 145 mmol/L 137  140  139   Potassium 3.5 - 5.1 mmol/L 4.3  4.3  4.3   Chloride 98 - 111 mmol/L 107  103  107    CO2 22 - 32 mmol/L 24  29  26    Calcium 8.9 - 10.3 mg/dL 9.5  78.2  9.4   Total Protein 6.5 - 8.1 g/dL 7.9  8.5  7.7   Total Bilirubin 0.0 - 1.2 mg/dL 0.3  0.3  0.2   Alkaline Phos 38 - 126 U/L 73  85  86   AST 15 - 41 U/L 15  16  17    ALT 0 - 44 U/L 22  23  17     RADIOGRAPHIC STUDIES: No results found.  ASSESSMENT & PLAN Kenneth Wilson 48 y.o. adult with medical history significant for RLE DVT who presents for a follow up visit.   After review of the labs, review of the records, and discussion with the patient the patients findings are most consistent with a provoked right lower extremity DVT.   A provoked venous thromboembolism (VTE) is one that has a clear inciting factor or event. Provoking factors include prolonged travel/immobility, surgery (particularly abdominal or orthopedic), trauma,  and pregnancy/ estrogen containing birth control. This patient was reported to have been taking estradiol, which would qualify as a transient provoking factor. As such we would recommend 3-6 months of anticoagulation therapy with consideration of additional therapy if symptoms persist. The anticoagulation therapy of choice in this situation is Eliquis therapy.  We discussed that estrogen therapy could continue but due to her extreme pain she would like to hold on her estrogen at this time.. The patient has a supply of this medication and can afford it without difficulty. We will  plan to see the patient back in 3 months time to reassess and assure they are doing well on treatment.    #Provoked right lower extremity DVT --findings at this time are consistent with a provoked VTE  --recommend the patient continue eliquis 5mg  BID for 6 months total duration. End of therapy in Feb 2025.  --patient denies any bleeding, bruising, or dark stools on this medication. It is well tolerated. No difficulties accessing/affording the medication  --Patient restarted estrogen therapy.  Discussed with patient that as long  as she is taking that she will need to continue her anticoagulation therapy as this likely provoked her VTE. --Labs today show white blood cell count 10.4, hemoglobin 13.0, MCV 89.6, platelets 235.  Liver and kidney function within normal limits. --RTC in 6 months' time with strict return precautions for overt signs of bleeding or recurrent VTE.   No orders of the defined types were placed in this encounter.   All questions were answered. The patient knows to call the clinic with any problems, questions or concerns.  A total of more than 30 minutes were spent on this encounter with face-to-face time and non-face-to-face time, including preparing to see the patient, ordering tests and/or medications, counseling the patient and coordination of care as outlined above.   Ulysees Barns, MD Department of Hematology/Oncology [facility] Phone: [phone] Pager: [phone] Email: [email or web address]  12/28/2023 4:22 PM

## 2024-04-20 ENCOUNTER — Encounter: Payer: Self-pay | Admitting: Neurology

## 2024-04-20 ENCOUNTER — Ambulatory Visit (INDEPENDENT_AMBULATORY_CARE_PROVIDER_SITE_OTHER): Payer: Medicare HMO | Admitting: Neurology

## 2024-04-20 VITALS — BP 109/74 | HR 73 | Ht 73.0 in | Wt 279.0 lb

## 2024-04-20 DIAGNOSIS — M542 Cervicalgia: Secondary | ICD-10-CM

## 2024-04-20 DIAGNOSIS — R202 Paresthesia of skin: Secondary | ICD-10-CM | POA: Diagnosis not present

## 2024-04-20 MED ORDER — ALPRAZOLAM 1 MG PO TABS: ORAL_TABLET | ORAL | 0 refills | Status: AC

## 2024-04-20 NOTE — Progress Notes (Signed)
 Chief Complaint  Patient presents with   New Patient (Initial Visit)    Rm 14. 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 Pt reports muscle pain and weakness in both arms all the time and in both legs occasionally. Pt is currently taking percocet  and cyclobenzaprine for the pain.     ASSESSMENT AND PLAN  Kenneth Wilson is a 48 y.o. adult   2 years history of progressive worsening neck pain, radiating pain to bilateral shoulder, lateral arm and lateral fingers,  Brisk reflex on exam   MRI of cervical spine to rule out cervical spondylitic myelopathy,  Reported family history of inflammatory intrinsic muscle disease, we will proceed with laboratory evaluation including CPK, inflammatory markers,  Follow-up depend on above workup result  DIAGNOSTIC DATA (LABS, IMAGING, TESTING) - I reviewed patient records, labs, notes, testing and imaging myself where available.   MEDICAL HISTORY:  Kenneth Wilson, is 48 year old transgender male, seen in request by his primary care doctor Kenneth Wilson for evaluation of neck pain, upper extremity paresthesia, weakness, initial evaluation April 20, 2024  History is obtained from the patient and review of electronic medical records. I personally reviewed pertinent available imaging films in PACS.   PMHx of  HLD Prostate Hypertrophy HTN Chronic pain, receiving Percocet  under his PCP Dr. Broadus Wilson Testosterone blocker on Aldactone, Provera, Estrogen since 2018. Hx of right DVT, on Eliquis  Bipolar, Schizoaffective disorder. Smoke  Since 2023, she began to notice slow worsening neck pain, radiating pain to bilateral shoulder, lateral arm, lateral fingers, sometimes has to hold certain posturing of her neck to alleviate radiating paresthesia through her arm, also noticed diffuse body achy pain, lower extremity pain, already on chronic pain management, taking Percocet , intermittent gait abnormality due to pain,  and subjective weakness  She reported family history of inflammatory muscle disease, remember her her mother suffered through similar condition in her 79s, muscle weakness, that was treated with IVIG,  She also have intermittent lower extremity tingling and pain, denied bowel and bladder incontinence,  Has been treated with estrogen, progesterone since 2018, also Aldactone  Labs in Feb 2025, Hg 13.0,  creat 1.28,   PHYSICAL EXAM:   Vitals:   04/20/24 1057  BP: 109/74  Pulse: 73  Weight: 279 lb (126.6 kg)  Height: 6\' 1"  (1.854 m)   Body mass index is 36.81 kg/m.  PHYSICAL EXAMNIATION:  Gen: NAD, conversant, well nourised, well groomed                     Cardiovascular: Regular rate rhythm, no peripheral edema, warm, nontender. Eyes: Conjunctivae clear without exudates or hemorrhage Neck: Supple, no carotid bruits. Pulmonary: Clear to auscultation bilaterally   NEUROLOGICAL EXAM:  MENTAL STATUS: Speech/cognition: Awake, alert, oriented to history taking and casual conversation CRANIAL NERVES: CN II: Visual fields are full to confrontation. Pupils are round equal and briskly reactive to light. CN III, IV, VI: extraocular movement are normal. No ptosis. CN V: Facial sensation is intact to light touch CN VII: Face is symmetric with normal eye closure  CN VIII: Hearing is normal to causal conversation. CN IX, X: Phonation is normal. CN XI: Head turning and shoulder shrug are intact  MOTOR: There is no pronator drift of out-stretched arms. Muscle bulk and tone are normal. Muscle strength is normal.  REFLEXES: Reflexes are 2+ and symmetric at the biceps, triceps, knees, and ankles. Plantar responses are sensory bilaterally  SENSORY: Intact to light touch, pinprick and  vibratory sensation are intact in fingers and toes.  COORDINATION: There is no trunk or limb dysmetria noted.  GAIT/STANCE: Able to get up from seated position arm crossed, but with effort, gait is  steady  REVIEW OF SYSTEMS:  Full 14 system review of systems performed and notable only for as above All other review of systems were negative.   ALLERGIES: Allergies  Allergen Reactions   Lisinopril  Swelling    HOME MEDICATIONS: Current Outpatient Medications  Medication Sig Dispense Refill   amLODipine  (NORVASC ) 10 MG tablet Take 10 mg by mouth daily.     apixaban  (ELIQUIS ) 5 MG TABS tablet Take 1 tablet (5 mg total) by mouth 2 (two) times daily. 60 tablet 5   benztropine (COGENTIN) 2 MG tablet Take 2 mg by mouth daily.     bisoprolol (ZEBETA) 10 MG tablet Take by mouth.     cyclobenzaprine (FLEXERIL) 5 MG tablet Take 5 mg by mouth at bedtime as needed.     estradiol (ESTRACE) 1 MG tablet Take 1 mg by mouth 3 (three) times daily.     gabapentin  (NEURONTIN ) 600 MG tablet Take 600 mg by mouth 2 (two) times daily.     hydrochlorothiazide (HYDRODIURIL) 25 MG tablet Take 25 mg by mouth daily.     medroxyPROGESTERone (PROVERA) 5 MG tablet Take 5 mg by mouth daily.     montelukast (SINGULAIR) 10 MG tablet Take 10 mg by mouth at bedtime.     oxyCODONE -acetaminophen  (PERCOCET ) 10-325 MG tablet Take 1 tablet by mouth every 6 (six) hours as needed.     promethazine (PHENERGAN) 25 MG tablet Take 25 mg by mouth 2 (two) times daily as needed.     simvastatin (ZOCOR) 5 MG tablet Take 5 mg by mouth at bedtime.     spironolactone (ALDACTONE) 100 MG tablet Take 100 mg by mouth 2 (two) times daily.     tamsulosin (FLOMAX) 0.4 MG CAPS capsule Take 0.4 mg by mouth daily.     venlafaxine (EFFEXOR) 75 MG tablet Take 75 mg by mouth daily.     ziprasidone (GEODON) 80 MG capsule Take 80 mg by mouth 2 (two) times daily with a meal.     No current facility-administered medications for this visit.    PAST MEDICAL HISTORY: Past Medical History:  Diagnosis Date   Bipolar 1 disorder (HCC)    Hyperlipemia    Hypertension    Schizoaffective disorder (HCC)     PAST SURGICAL HISTORY: History reviewed.  No pertinent surgical history.  FAMILY HISTORY: History reviewed. No pertinent family history.  SOCIAL HISTORY: Social History   Socioeconomic History   Marital status: Single    Spouse name: Not on file   Number of children: Not on file   Years of education: Not on file   Highest education level: Not on file  Occupational History   Not on file  Tobacco Use   Smoking status: Former    Current packs/day: 0.02    Types: Cigarettes   Smokeless tobacco: Never  Substance and Sexual Activity   Alcohol use: Yes    Comment: socially   Drug use: Not Currently   Sexual activity: Never  Other Topics Concern   Not on file  Social History Narrative   Regular exercise: [facility]   Caffeine use: 1 cup of coffee daily   Social Drivers of Health   Financial Resource Strain: Medium Risk (02/07/2023)   Overall Financial Resource Strain (CARDIA)  Difficulty of Paying Living Expenses: Somewhat hard  Food Insecurity: Food Insecurity Present (02/07/2023)   Hunger Vital Sign    Worried About Running Out of Food in the Last Year: Sometimes true    Ran Out of Food in the Last Year: Sometimes true  Transportation Needs: Unmet Transportation Needs (02/07/2023)   PRAPARE - Transportation    Lack of Transportation (Medical): Yes    Lack of Transportation (Non-Medical): Yes  Physical Activity: Inactive (02/07/2023)   Exercise Vital Sign    Days of Exercise per Week: 0 days    Minutes of Exercise per Session: 0 min  Stress: Stress Concern Present (02/07/2023)   [employer] of Occupational Health - Occupational Stress Questionnaire    Feeling of Stress : Very much  Social Connections: Socially Isolated (02/07/2023)   Social Connection and Isolation Panel [NHANES]    Frequency of Communication with Friends and Family: More than three times a week    Frequency of Social Gatherings with Friends and Family: Never    Attends Religious Services: Never    Database administrator or  Organizations: No    Attends Banker Meetings: Never    Marital Status: Never married  Intimate Partner Violence: Not on file      Phebe Brasil, M.D. Ph.D.  [facility] [address] [place], [place] [address] Ph: [phone] Fax: [phone]  CC:  Kenneth Feathers, MD [address] [place],  [place] [address]  Kenneth Feathers, MD

## 2024-04-23 ENCOUNTER — Telehealth: Payer: Self-pay | Admitting: Neurology

## 2024-04-23 NOTE — Telephone Encounter (Signed)
 Elihu Grumet Siegfried Dress: [id number] exp. 04/23/24-06/22/24 sent to GI [phone]

## 2024-05-09 ENCOUNTER — Ambulatory Visit
Admission: RE | Admit: 2024-05-09 | Discharge: 2024-05-09 | Disposition: A | Discharge status: Home / Self Care | Source: Ambulatory Visit | Attending: Neurology | Admitting: Neurology

## 2024-05-09 DIAGNOSIS — M542 Cervicalgia: Secondary | ICD-10-CM | POA: Diagnosis not present

## 2024-05-16 ENCOUNTER — Other Ambulatory Visit

## 2024-05-17 ENCOUNTER — Ambulatory Visit: Payer: Self-pay | Admitting: Neurology

## 2024-05-17 LAB — COMPREHENSIVE METABOLIC PANEL WITH GFR
ALT: 16 IU/L (ref 0–44)
AST: 14 IU/L (ref 0–40)
Albumin: 4.3 g/dL (ref 4.1–5.1)
Alkaline Phosphatase: 88 IU/L (ref 44–121)
BUN/Creatinine Ratio: 14 (ref 9–20)
BUN: 22 mg/dL (ref 6–24)
Bilirubin Total: <0.2 mg/dL (ref 0.0–1.2)
CO2: 18 mmol/L — ABNORMAL LOW (ref 20–29)
Calcium: 9.8 mg/dL (ref 8.7–10.2)
Chloride: 102 mmol/L (ref 96–106)
Creatinine, Ser: 1.53 mg/dL — ABNORMAL HIGH (ref 0.76–1.27)
Globulin, Total: 3.4 g/dL (ref 1.5–4.5)
Glucose: 100 mg/dL — ABNORMAL HIGH (ref 70–99)
Potassium: 4.1 mmol/L (ref 3.5–5.2)
Sodium: 137 mmol/L (ref 134–144)
Total Protein: 7.7 g/dL (ref 6.0–8.5)
eGFR: 56 mL/min/{1.73_m2} — ABNORMAL LOW (ref >59)

## 2024-05-17 LAB — CK: Total CK: 245 U/L (ref 49–439)

## 2024-05-17 LAB — ANA W/REFLEX IF POSITIVE: Anti Nuclear Antibody (ANA): NEGATIVE

## 2024-05-17 LAB — C-REACTIVE PROTEIN: CRP: 2 mg/L (ref 0–10)

## 2024-05-17 LAB — SEDIMENTATION RATE: Sed Rate: 45 mm/h — ABNORMAL HIGH (ref 0–15)

## 2024-05-17 LAB — VITAMIN B12: Vitamin B-12: 393 pg/mL (ref 232–1245)

## 2024-05-17 LAB — VITAMIN D 25 HYDROXY (VIT D DEFICIENCY, FRACTURES): Vit D, 25-Hydroxy: 13 ng/mL — ABNORMAL LOW (ref 30.0–100.0)

## 2024-05-17 LAB — RPR: RPR Ser Ql: NONREACTIVE

## 2024-05-17 LAB — TSH: TSH: 2.02 u[IU]/mL (ref 0.450–4.500)

## 2024-06-01 NOTE — Progress Notes (Signed)
 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 Dr. Onita will contact patient if additional recommendations are needed.

## 2024-06-04 NOTE — Telephone Encounter (Signed)
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 Dr. Onita will contact patient if additional recommendations are  needed.  ----- Message ----- From: Oneita Hoist, CMA Sent: 05/10/2024   1:37 PM EDT To: Pastor Falling, MD  Am work in provider please review splitting these results in 1/2 with pm doctor  ----- Message ----- From: Rosemarie Eather RAMAN, MD Sent: 05/10/2024   8:06 AM EDT To: Modena Onita, MD

## 2024-06-04 NOTE — Telephone Encounter (Signed)
 May suggest patient ask their primary care physician's refer to academic neurology

## 2024-06-27 ENCOUNTER — Inpatient Hospital Stay: Payer: Medicare HMO | Admitting: Hematology and Oncology

## 2024-06-27 ENCOUNTER — Other Ambulatory Visit: Payer: Self-pay | Admitting: Hematology and Oncology

## 2024-06-27 ENCOUNTER — Inpatient Hospital Stay: Payer: Medicare HMO | Attending: Hematology and Oncology

## 2024-06-27 VITALS — BP 118/71 | HR 64 | Temp 97.8°F | Resp 13 | Wt 276.2 lb

## 2024-06-27 DIAGNOSIS — F17210 Nicotine dependence, cigarettes, uncomplicated: Secondary | ICD-10-CM | POA: Insufficient documentation

## 2024-06-27 DIAGNOSIS — I82411 Acute embolism and thrombosis of right femoral vein: Secondary | ICD-10-CM

## 2024-06-27 DIAGNOSIS — Z7901 Long term (current) use of anticoagulants: Secondary | ICD-10-CM | POA: Diagnosis not present

## 2024-06-27 DIAGNOSIS — Z86718 Personal history of other venous thrombosis and embolism: Secondary | ICD-10-CM | POA: Insufficient documentation

## 2024-06-27 LAB — CBC WITH DIFFERENTIAL (CANCER CENTER ONLY)
Abs Immature Granulocytes: 0.03 K/uL (ref 0.00–0.07)
Basophils Absolute: 0.1 K/uL (ref 0.0–0.1)
Basophils Relative: 1 %
Eosinophils Absolute: 1 K/uL — ABNORMAL HIGH (ref 0.0–0.5)
Eosinophils Relative: 9 %
HCT: 35.1 % — ABNORMAL LOW (ref 39.0–52.0)
Hemoglobin: 12.1 g/dL — ABNORMAL LOW (ref 13.0–17.0)
Immature Granulocytes: 0 %
Lymphocytes Relative: 34 %
Lymphs Abs: 3.7 K/uL (ref 0.7–4.0)
MCH: 30.3 pg (ref 26.0–34.0)
MCHC: 34.5 g/dL (ref 30.0–36.0)
MCV: 88 fL (ref 80.0–100.0)
Monocytes Absolute: 0.9 K/uL (ref 0.1–1.0)
Monocytes Relative: 9 %
Neutro Abs: 5.2 K/uL (ref 1.7–7.7)
Neutrophils Relative %: 47 %
Platelet Count: 252 K/uL (ref 150–400)
RBC: 3.99 MIL/uL — ABNORMAL LOW (ref 4.22–5.81)
RDW: 14.3 % (ref 11.5–15.5)
WBC Count: 10.9 K/uL — ABNORMAL HIGH (ref 4.0–10.5)
nRBC: 0 % (ref 0.0–0.2)

## 2024-06-27 LAB — CMP (CANCER CENTER ONLY)
ALT: 19 U/L (ref 0–44)
AST: 15 U/L (ref 15–41)
Albumin: 4.3 g/dL (ref 3.5–5.0)
Alkaline Phosphatase: 76 U/L (ref 38–126)
Anion gap: 4 — ABNORMAL LOW (ref 5–15)
BUN: 26 mg/dL — ABNORMAL HIGH (ref 6–20)
CO2: 25 mmol/L (ref 22–32)
Calcium: 9.7 mg/dL (ref 8.9–10.3)
Chloride: 106 mmol/L (ref 98–111)
Creatinine: 1.19 mg/dL (ref 0.61–1.24)
GFR, Estimated: >60 mL/min (ref >60)
Glucose, Bld: 81 mg/dL (ref 70–99)
Potassium: 5 mmol/L (ref 3.5–5.1)
Sodium: 135 mmol/L (ref 135–145)
Total Bilirubin: 0.3 mg/dL (ref 0.0–1.2)
Total Protein: 7.7 g/dL (ref 6.5–8.1)
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
## 2024-08-10 ENCOUNTER — Other Ambulatory Visit: Payer: Self-pay | Admitting: *Deleted

## 2024-08-10 MED ORDER — APIXABAN 5 MG PO TABS: 5.0000 mg | ORAL_TABLET | Freq: Two times a day (BID) | ORAL | 5 refills | Status: AC

## 2024-10-15 ENCOUNTER — Telehealth: Payer: Self-pay | Admitting: *Deleted

## 2024-10-15 NOTE — Telephone Encounter (Signed)
 Received call from pt. She states she will be having 11 teeth extracted on 10/17/24. She is concerned about being on Eliquis  and having this done. Advised that Dr. Federico recommends holding the Eliquis  for 48 hours and then to resume when risk of bleeding has passed -usually that night after surgery or the next day. Advised to have her dentist send us  a medical release form to have Dr. Federico sign with his official recommendations. Pt voiced understanding.

## 2024-12-26 ENCOUNTER — Inpatient Hospital Stay

## 2024-12-26 ENCOUNTER — Inpatient Hospital Stay: Admitting: Hematology and Oncology
